# Patient Record
Sex: Male | Born: 1985 | Race: Black or African American | Hispanic: No | Marital: Single | Smoking: Former smoker
Health system: Southern US, Community
[De-identification: ages and names within clinical notes are randomized; demographics above are authoritative.]

## PROBLEM LIST (undated history)

## (undated) DIAGNOSIS — I10 Essential (primary) hypertension: Secondary | ICD-10-CM

## (undated) DIAGNOSIS — G4730 Sleep apnea, unspecified: Secondary | ICD-10-CM

## (undated) DIAGNOSIS — G4733 Obstructive sleep apnea (adult) (pediatric): Secondary | ICD-10-CM

## (undated) HISTORY — PX: FRACTURE SURGERY: SHX138

## (undated) HISTORY — DX: Sleep apnea, unspecified: G47.30

## (undated) HISTORY — PX: EYE SURGERY: SHX253

## (undated) MED ORDER — PANTOPRAZOLE 40 MG TAB, DELAYED RELEASE
40 mg | ORAL_TABLET | Freq: Every day | ORAL | Status: DC
Start: ? — End: 2013-09-10

## (undated) MED ORDER — ALBUTEROL SULFATE HFA 90 MCG/ACTUATION AEROSOL INHALER
90 mcg/actuation | Freq: Four times a day (QID) | RESPIRATORY_TRACT | Status: DC | PRN
Start: ? — End: 2014-05-02

## (undated) MED ORDER — HYDROCODONE-HOMATROPINE 5 MG-1.5 MG/5 ML (5 ML) ORAL SOLUTION
Freq: Four times a day (QID) | ORAL | Status: DC | PRN
Start: ? — End: 2014-05-02

## (undated) MED ORDER — AZITHROMYCIN 250 MG TAB
250 mg | PACK | ORAL | Status: DC
Start: ? — End: 2014-05-02

---
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
## 2011-07-26 MED ORDER — PROMETHAZINE 25 MG TAB
25 mg | ORAL_TABLET | Freq: Four times a day (QID) | ORAL | Status: DC | PRN
Start: 2011-07-26 — End: 2012-09-17

## 2011-07-26 MED ORDER — OXYCODONE-ACETAMINOPHEN 5 MG-325 MG TAB
5-325 mg | ORAL | Status: AC
Start: 2011-07-26 — End: 2011-07-26
  Administered 2011-07-26: 13:00:00 via ORAL

## 2011-07-26 MED ORDER — OXYCODONE-ACETAMINOPHEN 5 MG-325 MG TAB
5-325 mg | ORAL_TABLET | ORAL | Status: AC | PRN
Start: 2011-07-26 — End: 2011-08-02

## 2011-07-26 NOTE — ED Notes (Signed)
Splint applied by Veda Canning RN.

## 2011-07-26 NOTE — ED Provider Notes (Signed)
Patient is a 25 y.o. male presenting with ankle problem. The history is provided by the patient.   Ankle Injury   This is a new problem. The current episode started 1 to 2 hours ago. Pain location: twisted left ankle with pain to same. The pain is mild. Associated symptoms include limited range of motion. Pertinent negatives include no numbness. He has tried nothing for the symptoms. There has been a history of trauma.        No past medical history on file.     Past Surgical History   Procedure Date   ??? Hx tonsillectomy          No family history on file.     History     Social History   ??? Marital Status: N/A     Spouse Name: N/A     Number of Children: N/A   ??? Years of Education: N/A     Occupational History   ??? Not on file.     Social History Main Topics   ??? Smoking status: Current Everyday Smoker -- 0.5 packs/day   ??? Smokeless tobacco: Not on file   ??? Alcohol Use: Yes      1 x wk   ??? Drug Use: No   ??? Sexually Active:      Other Topics Concern   ??? Not on file     Social History Narrative   ??? No narrative on file                  ALLERGIES: Review of patient's allergies indicates no known allergies.      Review of Systems   Constitutional: Negative.    Musculoskeletal: Positive for joint swelling and arthralgias.   Skin: Negative.    Neurological: Negative.  Negative for numbness.   All other systems reviewed and are negative.        Filed Vitals:    07/26/11 0810   BP: 154/107   Pulse: 103   Temp: 98.2 ??F (36.8 ??C)   Resp: 18   Height: 5\' 8"  (1.727 m)   Weight: 129.275 kg (285 lb)            Physical Exam   Vitals reviewed.  Constitutional: He is oriented to person, place, and time. He appears well-developed and well-nourished. No distress.   Neck: Normal range of motion.   Musculoskeletal:        Left knee: Normal.         Left ankle: He exhibits decreased range of motion and swelling. He exhibits no ecchymosis, no deformity, no laceration and normal pulse. tenderness. Lateral malleolus tenderness found. No medial malleolus, no AITFL, no CF ligament, no posterior TFL, no head of 5th metatarsal and no proximal fibula tenderness found. Achilles tendon normal.   Neurological: He is alert and oriented to person, place, and time.   Skin: Skin is warm and dry.   Psychiatric: He has a normal mood and affect. His behavior is normal.        MDM     Differential Diagnosis; Clinical Impression; Plan:     Dr Benjamine Mola will see in office.  Amount and/or Complexity of Data Reviewed:   Clinical lab tests:  Reviewed      Splint, Short Leg  Date/Time: 07/26/2011 9:14 AM  Performed by: attendingPre-procedure re-eval: Immediately prior to the procedure, the patient was reevaluated and found suitable for the planned procedure and any planned medications.  Location details: left leg  Splint type: short leg  Approach: posterior  Supplies used: Ortho-Glass  Post-procedure: The splinted body part was neurovascularly unchanged following the procedure.  Patient tolerance: Patient tolerated the procedure well with no immediate complications.  My total time at bedside, performing this procedure was 1-15 minutes.

## 2011-07-26 NOTE — ED Notes (Signed)
Dr Benjamine Mola returned call to Dr Dareen Piano.

## 2011-07-26 NOTE — ED Notes (Signed)
Patient awaiting x-ray results.

## 2011-07-27 MED ORDER — CEFAZOLIN 2 G IN 100 ML 0.9% NS
2 gram/100 mL | Freq: Once | INTRAVENOUS | Status: AC
Start: 2011-07-27 — End: 2011-07-28
  Administered 2011-07-28: 18:00:00 via INTRAVENOUS

## 2011-07-27 NOTE — Other (Signed)
Left message for return call on only # listed

## 2011-07-27 NOTE — Other (Signed)
Patient's phone assessment complete.Pt verbalizes understanding of all pre-op instructions . Reinforced nothing to eat or drink after midnight on the day prior to surgery , this includes gum, mints, ice chips or water. Instructed that family must be present in building at all times.    Instructed Patient that they will be notified the day prior to surgery ( or the Friday before if surgery is on a Monday) of their arrival time by pre-op nurse with  Verbal understanding. Pt aware to bathe or shower with antibacterial soap on the night before  And am of surgery.      Pt instructed to obtain Hibiclens soap or surgical equivalent either here or at local pharmacy and wash surgical area in this for 20 seconds on dos. Instructed to wear freshly laundered clothes.    Patient safety information sheet given per registration with numbers for patient relations: [phone] and patient safety office:[phone]. Instructed pt that for any family member to obtain information or updates on pt's condition that they must have 4 digit code provided to them at registration. Pt verbalized understanding.     Instructed patient to continue  previous medications as prescribed prior to surgery and  to take percocet  On the Day of surgery with sip of water.  Do not hold any medications that you have not been instructed to do either at your pre-assessment or per your surgeon.    Continue all previous medications unless otherwise directed.      Instructed patient to stop the following medications prior to surgery: none    TYPE  1b   CASE   Labs per surgeon :unknown, called and left message for Dr Jaclynn Guarneri MA to fax orders on this pt  Labs per grid :none  EKG  :  none

## 2011-07-28 MED ORDER — LACTATED RINGERS IV
INTRAVENOUS | Status: DC
Start: 2011-07-28 — End: 2011-07-28

## 2011-07-28 MED ORDER — DIPHENHYDRAMINE HCL 50 MG/ML IJ SOLN
50 mg/mL | INTRAMUSCULAR | Status: DC | PRN
Start: 2011-07-28 — End: 2011-07-28

## 2011-07-28 MED ORDER — LIDOCAINE HCL 1 % (10 MG/ML) IJ SOLN
10 mg/mL (1 %) | INTRAMUSCULAR | Status: DC | PRN
Start: 2011-07-28 — End: 2011-07-28

## 2011-07-28 MED ORDER — FAMOTIDINE 20 MG TAB
20 mg | Freq: Once | ORAL | Status: AC
Start: 2011-07-28 — End: 2011-07-28
  Administered 2011-07-28: 17:00:00 via ORAL

## 2011-07-28 MED ORDER — PROMETHAZINE 25 MG/ML INJECTION
25 mg/mL | INTRAMUSCULAR | Status: DC | PRN
Start: 2011-07-28 — End: 2011-07-28

## 2011-07-28 MED ORDER — FENTANYL CITRATE (PF) 50 MCG/ML IJ SOLN
50 mcg/mL | Freq: Once | INTRAMUSCULAR | Status: AC
Start: 2011-07-28 — End: 2011-07-28
  Administered 2011-07-28: 17:00:00 via INTRAVENOUS

## 2011-07-28 MED ORDER — ONDANSETRON (PF) 4 MG/2 ML INJECTION
4 mg/2 mL | INTRAMUSCULAR | Status: DC | PRN
Start: 2011-07-28 — End: 2011-07-28

## 2011-07-28 MED ORDER — HYDROCODONE-ACETAMINOPHEN 5 MG-325 MG TAB
5-325 mg | ORAL | Status: DC | PRN
Start: 2011-07-28 — End: 2011-07-28

## 2011-07-28 MED ORDER — LACTATED RINGERS IV
INTRAVENOUS | Status: DC
Start: 2011-07-28 — End: 2011-07-28
  Administered 2011-07-28: 17:00:00 via INTRAVENOUS

## 2011-07-28 MED ORDER — MIDAZOLAM 1 MG/ML IJ SOLN
1 mg/mL | Freq: Once | INTRAMUSCULAR | Status: DC | PRN
Start: 2011-07-28 — End: 2011-07-28
  Administered 2011-07-28: 17:00:00 via INTRAVENOUS

## 2011-07-28 MED ORDER — DEXAMETHASONE SODIUM PHOSPHATE 4 MG/ML IJ SOLN
4 mg/mL | INTRAMUSCULAR | Status: DC | PRN
Start: 2011-07-28 — End: 2011-07-28

## 2011-07-28 MED ORDER — HYDROMORPHONE 2 MG/ML INJECTION SOLUTION
2 mg/mL | INTRAMUSCULAR | Status: DC | PRN
Start: 2011-07-28 — End: 2011-07-28

## 2011-07-28 NOTE — Op Note (Signed)
[facility]                            [address]                           [place], [place]. [address]                                [phone]                                OPERATIVE REPORT    NAME:  Eric Huynh, Eric Huynh,                         MR:  [id number]  Jr.  LOC:  SO                    SEXGillermina Hu:  [id number]  DOB:  14-Jan-1986            AGE:  25              PT:  S  ADMIT:  07/28/2011          DSCH:                 MSV:  SUR      PREPROCEDURE DIAGNOSIS: Left bimalleolar ankle fracture.    POSTPROCEDURE DIAGNOSIS: Left bimalleolar ankle fracture.    NAME OF PROCEDURE: Open reduction, internal fixation of left bimalleolar  ankle fracture.    SURGEON: Houston Siren, MD    ANESTHESIA: General anesthesia with LMA and popliteal block.    ESTIMATED BLOOD LOSS: Minimal.    TOURNIQUET TIME: 65 minutes at 300 mmHg.    ANTIBIOTIC PROPHYLAXIS: IV Ancef 2 grams given prior to procedure.    IMPLANTS USED: A Synthes titanium 1/3rd semitubular plate and 4 mm  cannulated screws.    INDICATIONS FOR PROCEDURE: The patient is a 25 year old African American  male with a left bimalleolar ankle fracture who presents for operative  fixation. Risks and benefits of procedure including, but not limited to  risk of anesthetic complications including myocardial infarction, stroke,  and death, as well as surgical complications including damage to nerves  and blood vessels, risk of infection, risk of incomplete pain relief,  risk of malunion, risk of nonunion, risk of posttraumatic arthritis, and  need for additional surgery have been discussed at length with the  patient. He understands the risks and wishes to proceed with surgery at  this time.    DETAILS OF PROCEDURE: The patient's correct operative site was marked  with indelible ink in the preoperative holding area. A block was placed  by the Department of Anesthesia. The patient was brought to the operating   table and placed supine. During a preop surgical timeout the left lower  extremity was identified as the correct surgical site and subsequently  prepped and draped in a standard sterile fashion using ChloraPrep  solution. A direct lateral approach to the distal fibula was performed  taking care to protect the superficial peroneal nerve branch. Fracture  site was identified, carefully curetted out and reduced in anatomic  alignment using reduction forceps. At  that point a 1/3rd semitubular  plate was then affixed using appropriate length screws and confirmed to  be adequately placed on image intensification. Medial approach to the  medial malleolus was then performed. The fracture site was then  identified and then reduced in anatomic alignment using a dental pick. At  that point 2 guidewires were placed for the cannulated screw system and  two 4 mm cannulated screws were advanced across the medial malleolus  fracture without difficulty. There was no evidence of syndesmotic injury  noted on intraoperative fluoroscopic testing. These wounds were irrigated  and closed using Monocryl and nylon sutures. A sterile dressing was  applied, followed by a well-padded posterior splint. Anesthesia was  discontinued. The patient was subsequently transferred back to recovery  room bed and taken to the recovery room in satisfactory condition. He  appeared to tolerate the procedure well. There were no apparent surgical  or anesthetic complications. All needle and sponge counts were correct.                [name], [name], MD     A                This is an unverified document unless signed by physician.    TID:  [id number]                                      DT:  07/28/2011  4:05 P  JOB:  [id number]        DOC#:  [id number]           DD:  07/28/2011    cc:   [name], [name], MD

## 2011-07-28 NOTE — Other (Signed)
Pt is here with report from OR. Pt has calm down since left OR. Pt is resting and pt has no co. Pt has good NV checks.

## 2011-07-28 NOTE — H&P (Addendum)
Visit Vitals   Item Reading   ??? BP 144/95   ??? Pulse 103   ??? Temp 98.1 ??F (36.7 ??C)   ??? Resp 20   ??? Ht 5\' 8"  (1.727 m)   ??? Wt 129.275 kg (285 lb)   ??? BMI 43.33 kg/m2   ??? SpO2 97%

## 2011-07-28 NOTE — Op Note (Signed)
[name]                            [address]                           [place], [place]. [address]                                [phone]                                OPERATIVE REPORT    NAME:  Eric Huynh, Eric Huynh,                         MR:  [id number]  Jr.  LOC:  SO                    SEXGillermina Hu:  [id number]  DOB:  1986/06/20            AGE:  25              PT:  S  ADMIT:  07/28/2011          DSCH:                 MSV:  SUR      PREPROCEDURE DIAGNOSIS: Left bimalleolar ankle fracture.    POSTPROCEDURE DIAGNOSIS: Left bimalleolar ankle fracture.    NAME OF PROCEDURE: Open reduction, internal fixation of left bimalleolar  ankle fracture.    SURGEON: Houston Siren, MD    ANESTHESIA: General anesthesia with LMA and popliteal block.    ESTIMATED BLOOD LOSS: Minimal.    TOURNIQUET TIME: 65 minutes at 300 mmHg.    ANTIBIOTIC PROPHYLAXIS: IV Ancef 2 grams given prior to procedure.    IMPLANTS USED: A Synthes titanium 1/3rd semitubular plate and 4 mm  cannulated screws.    INDICATIONS FOR PROCEDURE: The patient is a 25 year old African American  male with a left bimalleolar ankle fracture who presents for operative  fixation. Risks and benefits of procedure including, but not limited to  risk of anesthetic complications including myocardial infarction, stroke,  and death, as well as surgical complications including damage to nerves  and blood vessels, risk of infection, risk of incomplete pain relief,  risk of malunion, risk of nonunion, risk of posttraumatic arthritis, and  need for additional surgery have been discussed at length with the  patient. He understands the risks and wishes to proceed with surgery at  this time.    DETAILS OF PROCEDURE: The patient's correct operative site was marked  with indelible ink in the preoperative holding area. A block was placed  by the Department of Anesthesia. The patient was brought to the operating  table and placed  supine. During a preop surgical timeout the left lower  extremity was identified as the correct surgical site and subsequently  prepped and draped in a standard sterile fashion using ChloraPrep  solution. A direct lateral approach to the distal fibula was performed  taking care to protect the superficial peroneal nerve branch. Fracture  site was identified, carefully curetted out and reduced in anatomic  alignment using reduction forceps. At  that point a 1/3rd semitubular  plate was then affixed using appropriate length screws and confirmed to  be adequately placed on image intensification. Medial approach to the  medial malleolus was then performed. The fracture site was then  identified and then reduced in anatomic alignment using a dental pick. At  that point 2 guidewires were placed for the cannulated screw system and  two 4 mm cannulated screws were advanced across the medial malleolus  fracture without difficulty. There was no evidence of syndesmotic injury  noted on intraoperative fluoroscopic testing. These wounds were irrigated  and closed using Monocryl and nylon sutures. A sterile dressing was  applied, followed by a well-padded posterior splint. Anesthesia was  discontinued. The patient was subsequently transferred back to recovery  room bed and taken to the recovery room in satisfactory condition. He  appeared to tolerate the procedure well. There were no apparent surgical  or anesthetic complications. All needle and sponge counts were correct.                [name], [name], MD     A                This is an unverified document unless signed by physician.    TID:  [id number]                                      DT:  07/28/2011  4:05 P  JOB:  [id number]        DOC#:  [id number]           DD:  07/28/2011    cc:   [name], [name], MD

## 2012-04-23 NOTE — ED Provider Notes (Signed)
HPI Comments: Pt. Developed cough Friday and SOB yesterday.      Patient is a 26 y.o. male presenting with shortness of breath. The history is provided by the patient. No language interpreter was used.   Shortness of Breath  This is a new problem. The problem occurs continuously.The current episode started 2 days ago. The problem has not changed since onset.Associated symptoms include rhinorrhea and cough. Pertinent negatives include no fever, no headaches, no sore throat, no neck pain, no sputum production, no wheezing, no chest pain, no vomiting, no abdominal pain, no leg pain and no leg swelling. The problem's precipitants include smoke. He has tried nothing for the symptoms. Associated medical issues do not include COPD, chronic lung disease, CAD, heart failure or past MI.        Past Medical History   Diagnosis Date   ??? Unspecified sleep apnea      cpap   ??? Morbid obesity    ??? Ankle fracture, left    ??? Unspecified adverse effect of anesthesia      O2 sat drops   ??? Other ill-defined conditions      sleep apnea, non complaint with c-pap        Past Surgical History   Procedure Date   ??? Hx orthopaedic      ankle fx   ??? 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 Hx tonsillectomy    ??? Hx corneal transplant      right         History reviewed. No pertinent family history.     History     Social History   ??? Marital Status: SINGLE     Spouse Name: N/A     Number of Children: N/A   ??? Years of Education: N/A     Occupational History   ??? Not on file.     Social History Main Topics   ??? Smoking status: Current Everyday Smoker -- 0.5 packs/day for 5 years   ??? Smokeless tobacco: Never Used   ??? Alcohol Use: Yes      1 x wk   ??? Drug Use: No   ??? Sexually Active:      Other Topics Concern   ??? Not on file     Social History Narrative   ??? No narrative on file                  ALLERGIES: Review of patient's allergies indicates no known allergies.      Review of Systems   Constitutional: Negative for fever and chills.   HENT: Positive  for rhinorrhea. Negative for congestion, sore throat, sneezing, trouble swallowing, neck pain and neck stiffness.    Eyes: Negative for pain and redness.   Respiratory: Positive for cough and shortness of breath. Negative for sputum production, chest tightness and wheezing.    Cardiovascular: Negative for chest pain and leg swelling.   Gastrointestinal: Negative for nausea, vomiting and abdominal pain.   Genitourinary: Negative for dysuria and hematuria.   Musculoskeletal: Negative for back pain and gait problem.   Neurological: Negative for weakness, numbness and headaches.       Filed Vitals:    04/23/12 2124   BP: 154/89   Pulse: 103   Temp: 98.4 ??F (36.9 ??C)   Resp: 16   Height: 5\' 8"  (1.727 m)   Weight: 161.481 kg (356 lb)   SpO2: 95%  Physical Exam   Constitutional: He is oriented to person, place, and time. He appears well-developed and well-nourished.   HENT:   Head: Normocephalic and atraumatic.   Eyes: EOM are normal. Pupils are equal, round, and reactive to light.        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    Neck: Normal range of motion. Neck supple.   Cardiovascular: Normal rate and regular rhythm.    No murmur heard.  Pulmonary/Chest: Effort normal and breath sounds normal. No respiratory distress. He has no wheezes. He exhibits no tenderness.   Abdominal: Soft. Bowel sounds are normal. There is no tenderness.   Musculoskeletal: Normal range of motion. He exhibits no edema.   Lymphadenopathy:     He has no cervical adenopathy.   Neurological: He is alert and oriented to person, place, and time.   Skin: Skin is warm and dry.        MDM     Amount and/or Complexity of Data Reviewed:   Tests in the radiology section of CPT??:  Ordered and reviewed  Tests in the medicine section of the CPT??:  Ordered and reviewed  Risk of Significant Complications, Morbidity, and/or Mortality:   Presenting problems:  Low  Diagnostic procedures:  Low  Management options:  Low  Progress:   Patient  progress:  Stable      Procedures    XR CHEST PA LAT (Final result)   Result time:04/23/12 2327      Final result by Rad Results In Edi (04/23/12 23:27:33)      Impression:    IMPRESSION: Negative for acute change.            Narrative:    CHEST X-RAY, 2 views.    HISTORY: Cough.    TECHNIQUE: PA and lateral views.    COMPARISON: None    FINDINGS: The lungs are clear. The heart size is normal. The costophrenic  angles are sharp. The pulmonary vasculature is unremarkable. Included portion  of the upper abdomen is unremarkable.

## 2012-04-23 NOTE — ED Notes (Signed)
Coughing started on Friday and pt states when he's coughing he is having trouble taking a breath. Pt winded walking from lobby to triage room. Been using cough drops and cold pill (possible contact) and that's not helping. Faint wheezes noted when breath sounds auscultated from back.

## 2012-04-24 MED ORDER — BENZONATATE 100 MG CAP
100 mg | ORAL_CAPSULE | Freq: Three times a day (TID) | ORAL | Status: AC | PRN
Start: 2012-04-24 — End: 2012-05-01

## 2012-04-24 MED ORDER — AZITHROMYCIN 250 MG TAB
250 mg | PACK | ORAL | Status: DC
Start: 2012-04-24 — End: 2012-09-17

## 2012-04-24 MED ADMIN — albuterol-ipratropium (DUO-NEB) 2.5 MG-0.5 MG/3 ML: RESPIRATORY_TRACT | @ 04:00:00 | NDC 00487020101

## 2012-04-24 MED FILL — IPRATROPIUM-ALBUTEROL 2.5 MG-0.5 MG/3 ML NEB SOLUTION: 2.5 mg-0.5 mg/3 ml | RESPIRATORY_TRACT | Qty: 3

## 2012-04-24 NOTE — ED Notes (Signed)
I have reviewed discharge instructions with the patient. Prescriptions was given.   The patient verbalized understanding.  Discharged ambulatory  in no acute distress.

## 2012-09-17 LAB — CBC WITH AUTOMATED DIFF
ABS. BASOPHILS: 0 10*3/uL (ref 0.0–0.2)
ABS. EOSINOPHILS: 0.1 10*3/uL (ref 0.0–0.8)
ABS. IMM. GRANS.: 0 10*3/uL (ref 0.0–0.5)
ABS. LYMPHOCYTES: 1.8 10*3/uL (ref 0.5–4.6)
ABS. MONOCYTES: 0.5 10*3/uL (ref 0.1–1.3)
ABS. NEUTROPHILS: 7.6 10*3/uL (ref 1.7–8.2)
BASOPHILS: 0 % (ref 0.0–2.0)
EOSINOPHILS: 1 % (ref 0.5–7.8)
HCT: 42.6 % (ref 41.1–50.3)
HGB: 13.9 g/dL (ref 13.6–17.2)
IMMATURE GRANULOCYTES: 0.3 % (ref 0.0–5.0)
LYMPHOCYTES: 18 % (ref 13–44)
MCH: 30.2 PG (ref 26.1–32.9)
MCHC: 32.6 g/dL (ref 31.4–35.0)
MCV: 92.6 FL (ref 79.6–97.8)
MONOCYTES: 5 % (ref 4.0–12.0)
MPV: 10.8 FL (ref 10.8–14.1)
NEUTROPHILS: 76 % (ref 43–78)
PLATELET: 307 10*3/uL (ref 150–450)
RBC: 4.6 M/uL (ref 4.23–5.67)
RDW: 12.3 % (ref 11.9–14.6)
WBC: 10.1 10*3/uL (ref 4.3–11.1)

## 2012-09-17 LAB — METABOLIC PANEL, COMPREHENSIVE
A-G Ratio: 0.8 — ABNORMAL LOW (ref 1.2–3.5)
ALT (SGPT): 39 U/L (ref 12–65)
AST (SGOT): 26 U/L (ref 15–37)
Albumin: 3.5 g/dL (ref 3.5–5.0)
Alk. phosphatase: 54 U/L (ref 50–136)
Anion gap: 8 mmol/L (ref 7–16)
BUN: 16 MG/DL (ref 6–23)
Bilirubin, total: 0.5 MG/DL (ref 0.2–1.1)
CO2: 30 MMOL/L (ref 21–32)
Calcium: 9.4 MG/DL (ref 8.3–10.4)
Chloride: 101 MMOL/L (ref 98–107)
Creatinine: 1.2 MG/DL (ref 0.8–1.5)
GFR est AA: >60 mL/min/{1.73_m2} (ref >60)
GFR est non-AA: >60 mL/min/{1.73_m2} (ref >60)
Globulin: 4.2 g/dL — ABNORMAL HIGH (ref 2.3–3.5)
Glucose: 98 MG/DL (ref 65–100)
Potassium: 4.4 MMOL/L (ref 3.5–5.1)
Protein, total: 7.7 g/dL (ref 6.3–8.2)
Sodium: 139 MMOL/L (ref 136–145)

## 2012-09-17 LAB — LIPASE: Lipase: 87 U/L (ref 73–393)

## 2012-09-17 MED ADMIN — pantoprazole (PROTONIX) tablet 40 mg: ORAL | @ 07:00:00 | NDC 00008060704

## 2012-09-17 MED FILL — PROTONIX 40 MG TABLET,DELAYED RELEASE: 40 mg | ORAL | Qty: 1

## 2012-09-17 NOTE — ED Notes (Signed)
Pt c/o pain in his stomach that feel like hunger pains. Pt also c/o frequent urination, denies burning with urination or penile discharge.

## 2012-09-17 NOTE — ED Provider Notes (Addendum)
HPI Comments: 26 obese bm complains of epigastric pain and feeling like he is hungry even after he has eaten. No vomiting. The pain at times does radiate to the back. No vomiting.     Patient is a 26 y.o. male presenting with epigastric pain.   Epigastric Pain   Associated symptoms include dysuria. Pertinent negatives include no fever, no diarrhea, no nausea, no vomiting, no constipation, no hematuria, no chest pain and no back pain.        Past Medical History   Diagnosis Date   ??? Unspecified sleep apnea      cpap   ??? Morbid obesity    ??? Ankle fracture, left    ??? Unspecified adverse effect of anesthesia      O2 sat drops   ??? Other ill-defined conditions      sleep apnea, non complaint with c-pap        Past Surgical History   Procedure Laterality Date   ??? Hx orthopaedic       ankle fx   ??? 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 Hx tonsillectomy     ??? Hx corneal transplant       right         History reviewed. No pertinent family history.     History     Social History   ??? Marital Status: SINGLE     Spouse Name: N/A     Number of Children: N/A   ??? Years of Education: N/A     Occupational History   ??? Not on file.     Social History Main Topics   ??? Smoking status: Current Every Day Smoker -- 0.50 packs/day for 5 years   ??? Smokeless tobacco: Never Used   ??? Alcohol Use: Yes      Comment: 1 x wk   ??? Drug Use: No   ??? Sexually Active:      Other Topics Concern   ??? Not on file     Social History Narrative   ??? No narrative on file                  ALLERGIES: Review of patient's allergies indicates no known allergies.      Review of Systems   Constitutional: Negative for fever and fatigue.   HENT: Negative for neck pain and neck stiffness.    Respiratory: Negative for cough, choking and shortness of breath.    Cardiovascular: Negative for chest pain.   Gastrointestinal: Positive for abdominal pain. Negative for nausea, vomiting, diarrhea, constipation, blood in stool, abdominal distention, anal bleeding and rectal pain.    Genitourinary: Positive for dysuria. Negative for urgency, hematuria and flank pain.   Musculoskeletal: Negative for back pain.   Skin: Negative for rash.   All other systems reviewed and are negative.        Filed Vitals:    09/17/12 0026   BP: 159/91   Pulse: 112   Temp: 98.1 ??F (36.7 ??C)   Resp: 20   Height: 5\' 8"  (1.727 m)   Weight: 136.079 kg (300 lb)   SpO2: 96%            Physical Exam   Nursing note and vitals reviewed.  Constitutional: He is oriented to person, place, and time. He appears well-developed and well-nourished. No distress.   HENT:   Head: Normocephalic and atraumatic.   Right Ear: External ear normal.   Left  Ear: External ear normal.   Nose: Nose normal.   Mouth/Throat: Oropharynx is clear and moist. No oropharyngeal exudate.   Eyes: Conjunctivae and EOM are normal. Pupils are equal, round, and reactive to light.   Neck: Normal range of motion. Neck supple.   Cardiovascular: Normal rate, regular rhythm, normal heart sounds and intact distal pulses.    Pulmonary/Chest: Effort normal and breath sounds normal. No respiratory distress.   Abdominal: Soft. Bowel sounds are normal. He exhibits distension. He exhibits no mass. There is no tenderness. There is no guarding.   Minimal tenderness.    Musculoskeletal: Normal range of motion. He exhibits no tenderness.   Neurological: He is alert and oriented to person, place, and time.   Skin: Skin is warm. No rash noted.        MDM    Procedures

## 2012-09-17 NOTE — ED Notes (Signed)
I have reviewed discharge instructions and prescriptions with the patient.  The patient verbalized understanding. We also discussed the need to get another CPAP.

## 2012-09-17 NOTE — ED Notes (Signed)
Adjusted pillow behind patient to try and improve airway and respirations.

## 2013-09-10 MED ORDER — IPRATROPIUM-ALBUTEROL 2.5 MG-0.5 MG/3 ML NEB SOLUTION
2.5 mg-0.5 mg/3 ml | RESPIRATORY_TRACT | Status: AC
Start: 2013-09-10 — End: 2013-09-10
  Administered 2013-09-10: 08:00:00 via RESPIRATORY_TRACT

## 2013-09-10 NOTE — ED Notes (Signed)
Pt verbalized understanding of discharge teaching    Ambulatory from ED

## 2013-09-10 NOTE — ED Notes (Signed)
CXR neg  Feels some better after neb

## 2013-09-10 NOTE — ED Provider Notes (Signed)
HPI Comments: Some OSA in past. URI sx > 2 weeks. No dyspnea w/ cough--> yellow. No CP    Patient is a 27 y.o. male presenting with shortness of breath. The history is provided by the patient.   Shortness of Breath  This is a new problem. The current episode started more than 2 days ago. The problem has been gradually worsening. Associated symptoms include coryza, rhinorrhea, cough and sputum production. Pertinent negatives include no fever, no headaches, no sore throat, no ear pain, no neck pain, no hemoptysis, no wheezing, no orthopnea, no chest pain, no vomiting, no abdominal pain, no rash, no leg swelling and no claudication. Associated medical issues include chronic lung disease. Associated medical issues do not include asthma or COPD.        Past Medical History   Diagnosis Date   ??? Unspecified sleep apnea      cpap   ??? Morbid obesity    ??? Ankle fracture, left    ??? Unspecified adverse effect of anesthesia      O2 sat drops   ??? Other ill-defined conditions      sleep apnea, non complaint with c-pap        Past Surgical History   Procedure Laterality Date   ??? Hx orthopaedic       ankle fx   ??? 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 Hx tonsillectomy     ??? Hx corneal transplant       right         History reviewed. No pertinent family history.     History     Social History   ??? Marital Status: SINGLE     Spouse Name: N/A     Number of Children: N/A   ??? Years of Education: N/A     Occupational History   ??? Not on file.     Social History Main Topics   ??? Smoking status: Current Every Day Smoker -- 0.50 packs/day for 5 years   ??? Smokeless tobacco: Never Used   ??? Alcohol Use: Yes      Comment: 1 x wk   ??? Drug Use: No   ??? Sexually Active:      Other Topics Concern   ??? Not on file     Social History Narrative   ??? No narrative on file                  ALLERGIES: Review of patient's allergies indicates no known allergies.      Review of Systems   Constitutional: Negative for fever and chills.   HENT: Positive for  rhinorrhea. Negative for ear pain, sore throat and neck pain.    Respiratory: Positive for cough, sputum production and shortness of breath. Negative for hemoptysis and wheezing.    Cardiovascular: Negative for chest pain, palpitations, orthopnea, claudication and leg swelling.   Gastrointestinal: Negative for vomiting, abdominal pain and diarrhea.   Genitourinary: Negative for dysuria and flank pain.   Musculoskeletal: Negative for back pain.   Skin: Negative for color change and rash.   Neurological: Negative for syncope and headaches.       Filed Vitals:    09/10/13 0143 09/10/13 0210   BP: 164/109    Pulse: 114 112   Temp: 98.9 ??F (37.2 ??C)    Resp: 22    Height: 5\' 8"  (1.727 m)    Weight: 165.563 kg (365 lb)  SpO2: 93% 96%            Physical Exam   Nursing note and vitals reviewed.  Constitutional: He is oriented to person, place, and time. He appears well-developed and well-nourished. No distress.   HENT:   Head: Normocephalic and atraumatic.   Mouth/Throat: Oropharynx is clear and moist. No oropharyngeal exudate.   Eyes: EOM are normal. Pupils are equal, round, and reactive to light.   Neck: Normal range of motion. Neck supple.   Cardiovascular: Normal rate and regular rhythm.    No murmur heard.  Pulmonary/Chest: Breath sounds normal. No respiratory distress.   Neurological: He is alert and oriented to person, place, and time. Gait normal.   Nl speech   Skin: Skin is warm and dry.   Psychiatric: He has a normal mood and affect. His speech is normal.        MDM     Differential Diagnosis; Clinical Impression; Plan:     PNA  RAD  PNA    Amount and/or Complexity of Data Reviewed:   Tests in the radiology section of CPT??:  Reviewed   Independant visualization of image, tracing, or specimen:  Yes  Risk of Significant Complications, Morbidity, and/or Mortality:   Presenting problems:  Moderate  Diagnostic procedures:  Moderate  Management options:  Moderate  General Comments: Sat 93% but suspect from OSA long  term  Progress:   Patient progress:  Stable      Procedures

## 2013-09-10 NOTE — ED Notes (Signed)
Respiratory at bedside

## 2014-03-19 MED ORDER — HYDROCODONE-ACETAMINOPHEN 5 MG-325 MG TAB
5-325 mg | ORAL_TABLET | ORAL | Status: DC | PRN
Start: 2014-03-19 — End: 2014-05-02

## 2014-03-19 NOTE — ED Provider Notes (Signed)
HPI Comments: Patient is had right foot pain since Monday. He denies any trauma and denies any similar pain in the past.  Describes the pain is sharp and achy and worse with ambulation.  He states the pain has been getting progressively worse.he has had surgery on his left foot but never had any surgery on his right foot.  He denies any fever or chills.    Patient is a 28 y.o. male presenting with foot pain. The history is provided by the patient.   Foot Pain   This is a new problem. The problem occurs constantly. The problem has been gradually worsening. The pain is moderate. Associated symptoms include limited range of motion. Pertinent negatives include no numbness. The symptoms are aggravated by activity and standing. He has tried nothing for the symptoms. The treatment provided no relief. There has been no history of extremity trauma.        Past Medical History   Diagnosis Date   ??? Unspecified sleep apnea      cpap   ??? Morbid obesity (HCC)    ??? Ankle fracture, left    ??? Unspecified adverse effect of anesthesia      O2 sat drops   ??? Other ill-defined conditions(799.89)      sleep apnea, non complaint with c-pap        Past Surgical History   Procedure Laterality Date   ??? Hx orthopaedic       ankle fx   ??? 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 Hx tonsillectomy     ??? Hx corneal transplant       right         History reviewed. No pertinent family history.     History     Social History   ??? Marital Status: SINGLE     Spouse Name: N/A     Number of Children: N/A   ??? Years of Education: N/A     Occupational History   ??? Not on file.     Social History Main Topics   ??? Smoking status: Current Every Day Smoker -- 0.50 packs/day for 5 years   ??? Smokeless tobacco: Never Used   ??? Alcohol Use: Yes      Comment: 1 x wk   ??? Drug Use: No   ??? Sexual Activity: Not on file     Other Topics Concern   ??? Not on file     Social History Narrative                  ALLERGIES: Review of patient's allergies indicates no known allergies.       Review of Systems   Constitutional: Negative for fever and chills.   Gastrointestinal: Negative for nausea and vomiting.   Neurological: Negative for numbness.       Filed Vitals:    03/19/14 0541   BP: 159/84   Pulse: 107   Temp: 98.3 ??F (36.8 ??C)   Resp: 18   Height: 5\' 8"  (1.727 m)   Weight: 174.635 kg (385 lb)   SpO2: 99%            Physical Exam   Constitutional: He is oriented to person, place, and time. He appears well-developed and well-nourished.   HENT:   Head: Normocephalic and atraumatic.   Eyes: Conjunctivae are normal. Pupils are equal, round, and reactive to light.   Neck: Normal range of motion. Neck supple.  Musculoskeletal: He exhibits tenderness. He exhibits no edema.        Feet:    Tenderness to palpation at the indicated area.  There is no induration or redness noted and no swelling in comparison with his left foot   Neurological: He is alert and oriented to person, place, and time.   Skin: Skin is warm and dry.   Psychiatric: He has a normal mood and affect. His behavior is normal.   Nursing note and vitals reviewed.       MDM  Number of Diagnoses or Management Options  Foot sprain, right, initial encounter:   Diagnosis management comments: Differential diagnosis: Foot fracture, foot contusion, cellulitis  7:13 AM discussed results with patient and normal x-ray findings       Amount and/or Complexity of Data Reviewed  Tests in the radiology section of CPT??: ordered and reviewed  Independent visualization of images, tracings, or specimens: yes    Risk of Complications, Morbidity, and/or Mortality  Presenting problems: moderate  Diagnostic procedures: low  Management options: moderate    Patient Progress  Patient progress: stable      Procedures

## 2014-03-19 NOTE — ED Notes (Signed)
Per Dr. Domenic Politeircle work excuse given for 03/18/2014 and 03/19/2014.

## 2014-03-19 NOTE — ED Notes (Signed)
Pt c/o r foot pain, onset Monday and gradually got worse until tonight when he "has to crawl".

## 2014-03-19 NOTE — ED Notes (Signed)
Discharge instr., work excuse And RX x 1 Given and reviewed with pt, questions answered and pt verbalized understanding. Out via wheelchair with family assistance.

## 2014-05-02 DIAGNOSIS — Z9989 Dependence on other enabling machines and devices: Secondary | ICD-10-CM

## 2014-05-02 DIAGNOSIS — G4733 Obstructive sleep apnea (adult) (pediatric): Secondary | ICD-10-CM | POA: Insufficient documentation

## 2014-05-02 DIAGNOSIS — I10 Essential (primary) hypertension: Secondary | ICD-10-CM | POA: Insufficient documentation

## 2014-05-02 MED ORDER — LISINOPRIL-HYDROCHLOROTHIAZIDE 10 MG-12.5 MG TAB
ORAL_TABLET | Freq: Every day | ORAL | Status: DC
Start: 2014-05-02 — End: 2014-05-19

## 2014-05-02 NOTE — Patient Instructions (Signed)
DASH Diet: After Your Visit  Your Care Instructions  The DASH diet is an eating plan that can help lower your blood pressure. DASH stands for Dietary Approaches to Stop Hypertension. Hypertension is high blood pressure.  The DASH diet focuses on eating foods that are high in calcium, potassium, and magnesium. These nutrients can lower blood pressure. The foods that are highest in these nutrients are fruits, vegetables, low-fat dairy products, nuts, seeds, and legumes. But taking calcium, potassium, and magnesium supplements instead of eating foods that are high in those nutrients does not have the same effect. The DASH diet also includes whole grains, fish, and poultry.  The DASH diet is one of several lifestyle changes your doctor may recommend to lower your high blood pressure. Your doctor may also want you to decrease the amount of sodium in your diet. Lowering sodium while following the DASH diet can lower blood pressure even further than just the DASH diet alone.  Follow-up care is a key part of your treatment and safety. Be sure to make and go to all appointments, and call your doctor if you are having problems. It's also a good idea to know your test results and keep a list of the medicines you take.  How can you care for yourself at home?  Following the DASH diet  ?? Eat 4 to 5 servings of fruit each day. A serving is 1 medium-sized piece of fruit, ?? cup chopped or canned fruit, 1/4 cup dried fruit, or 4 ounces (?? cup) of fruit juice. Choose fruit more often than fruit juice.  ?? Eat 4 to 5 servings of vegetables each day. A serving is 1 cup of lettuce or raw leafy vegetables, ?? cup of chopped or cooked vegetables, or 4 ounces (?? cup) of vegetable juice. Choose vegetables more often than vegetable juice.  ?? Get 2 to 3 servings of low-fat and fat-free dairy each day. A serving is 8 ounces of milk, 1 cup of yogurt, or 1 ?? ounces of cheese.   ?? Eat 6 to 8 servings of grains each day. A serving is 1 slice of bread, 1 ounce of dry cereal, or ?? cup of cooked rice, pasta, or cooked cereal. Try to choose whole-grain products as much as possible.  ?? Limit lean meat, poultry, and fish to 2 servings each day. A serving is 3 ounces, about the size of a deck of cards.  ?? Eat 4 to 5 servings of nuts, seeds, and legumes (cooked dried beans, lentils, and split peas) each week. A serving is 1/3 cup of nuts, 2 tablespoons of seeds, or ?? cup of cooked beans or peas.  ?? Limit fats and oils to 2 to 3 servings each day. A serving is 1 teaspoon of vegetable oil or 2 tablespoons of salad dressing.  ?? Limit sweets and added sugars to 5 servings or less a week. A serving is 1 tablespoon jelly or jam, ?? cup sorbet, or 1 cup of lemonade.  ?? Eat less than 2,300 milligrams (mg) of sodium a day. If you have high blood pressure, diabetes, or chronic kidney disease, if you are African-American, or if you are older than age 50, try to limit the amount of sodium you eat to less than 1,500 mg a day.  Tips for success  ?? Start small. Do not try to make dramatic changes to your diet all at once. You might feel that you are missing out on your favorite foods and then be more   likely to not follow the plan. Make small changes, and stick with them. Once those changes become habit, add a few more changes.  ?? Try some of the following:  ?? Make it a goal to eat a fruit or vegetable at every meal and at snacks. This will make it easy to get the recommended amount of fruits and vegetables each day.  ?? Try yogurt topped with fruit and nuts for a snack or healthy dessert.  ?? Add lettuce, tomato, cucumber, and onion to sandwiches.  ?? Combine a ready-made pizza crust with low-fat mozzarella cheese and lots of vegetable toppings. Try using tomatoes, squash, spinach, broccoli, carrots, cauliflower, and onions.  ?? Have a variety of cut-up vegetables with a low-fat dip as an appetizer  instead of chips and dip.  ?? Sprinkle sunflower seeds or chopped almonds over salads. Or try adding chopped walnuts or almonds to cooked vegetables.  ?? Try some vegetarian meals using beans and peas. Add garbanzo or kidney beans to salads. Make burritos and tacos with mashed pinto beans or black beans.   Where can you learn more?   Go to MetropolitanBlog.huhttp://www.healthwise.net/[facility]  Enter H967 in the search box to learn more about "DASH Diet: After Your Visit."   ?? 2006-2015 Healthwise[employer] Inc[employer]. Care instructions adapted under license by [employer] (which disclaims liability or warranty for this information). This care instruction is for use with your licensed healthcare professional. If you have questions about a medical condition or this instruction, always ask your healthcare professional. Healthwise, Incorporated disclaims any warranty or liability for your use of this information.  Content Version: 10.5.422740; Current as of: November 15, 2013    Assessment:  1. Tobacco dependence: 05-02-14 education    2. Obesity    3. OSA on CPAP    4. HTN      Plan:   Modifyer-25 is indicated today for the tobacco cessation counseling as this was a separate issue needing addressed apart from their scheduled visit today.  [phone], face to face counseling as noted above and below regarding smoking cessation.    Strongly advised and encouraged to stop smoking.  Have gone over risk factors for various forms of cancer, pulmonary problems that can develop.  Quitting "cold [place]" has the best success rate.  I have addressed this individual's willingness to stop smoking.  I think the best quit plans involve the most proven resources who have the most proven resources who have the most tried and true methods. Please utilize the following: www.cancer.org, http://www.robinson.org/www.surgeongerneral.gov/tobacco/ and MechanicalArm.dkwww.smokefree.gov, www.familydoctor.org, MapleFlower.dkwww.lung.org/stop-smoking/, http://gates.com/www.cdc.gov/tobacco/quit_smoking/index.htm   I also have on staff a certified tobacco cessation counselor that I would be happy for you to meet with for sessions.  Please consider this and let me or staff know if you would like to take advantage of this service.  If you begin wellbutrin or chantix and have any depressive thoughts while taking these medicaitons, then please let us know and stop the medicines immediately.  Hypertension handout will be given and low salt diet is preferred. Eliminate smoking where appropriate.  Caution about the use of motrin, Advil, ibuprofen, Celebrex, aleve, naproxen etc which can raise blood pressure.  Tylenol (acetaminophen does not raise blood pressure). If you use one of the above, and it is necessary for the control of pain related to another disease or condition, then its benefits very likely outweigh the risks or problems associated with NOT using them. This is a decision you may have to make.  I's always helpful  to take those medications for two weeks as directed, then switch to tylenol for tow weeks etc.  Personal instruction is given.  For your information, consider the following: significant weight loss can result in a drop of 5-8710mm of blood pressure!  The "DASH" diet (see bookstore or go to MobTheme.nlwww.mayocliinc.com and print the DASH diet) will lower 8-7414mm of blood pressure.   Salt restriction will lower your pressure 2-858mm.  Lowering alcohol consumption to moderate use will lower your pressure 2-4 mm.  Continue efforts at weight loss or maintenance, watching sodium in your diet and trying to maintain an exercise regimen.  For those patients interested in an integrative option to help control their blood pressure, please try the following nonprescription natural remedies which you may find lower your blood pressure!  1. Calcium $ Magnesium has been shown to help reduce blood pressure. We stock "Osteoforce"  2. L-Arginine 750mg  1 a day or twice a day (available here)   3. Vitamin D3, 2,000IU daily with a regular meal is best absorbed  4. Co-Q10, 100mg  twice a day with a meal  5. Celery, a few sticks a day if you like celery.  Also, do not purchase non-organic celery, I recommend buy organic veggies if they are on the "dirty dozen."  Also, parsley and strawberries are natural diuretics.    Your goal for blood pressure target if you are older than 28 years old is equal to or lower than 149/89  Your goal for blood pressure target if you are younger than 28 years old is equal to or lower than 139/89    Recommend beginning a blood pressure med called lisin/hctz  Orders Placed This Encounter   ??? lisinopril-hydrochlorothiazide (PRINZIDE, ZESTORETIC) 10-12.5 mg per tablet     Followup:  Follow-up Disposition:  Return in about 3 weeks (around 05/23/2014) for recheck of bp and baseline hdl, htn-profile, fasting pre-d.

## 2014-05-02 NOTE — Progress Notes (Signed)
Subjective:  Eric Huynh is a 28 y.o. male presents today with medical problems and to obtain refills if necessary, and they are also meeting me as their new physician for the first time as their initial visit   today with possible new onset HTN. Further symptoms include the fact that (HPI) they have had dizzyness and elevated BPs at home or with employee nurse  There is a family history of hypertension YES.  Major risk factors for hypertension include tobacco use, obesity/elevated BMI, physical inactivity, dyslipidemia and diabetes?  Systems review of cardiovascular and pulmonary systems reveal no complaints or pertinent postivies.    How much are you exercising? not active  Do you smoke? YES  Are you taking your medicines as directed most every day? NO  Do you follow a low sodium DASH diet or similar high blood pressure diet? NO  Do you check your bp at home with an automated blood pressure device? NO  If you don't have a home bp machine, you should purchase one at home and begin to check your pressure at home on a regular basis.  Your blood pressure goal is listed under the "plan section" below    No Known Allergies  PMH, MEDS reviewed    Objective:  Blood pressure 148/107, pulse 101, height 5\' 8"  (1.727 m), weight 385 lb (174.635 kg).  General- pleasant, no distress  Psych- alert and oriented to person, place and time  Mood and affect are appropriate to the visit  rrr s mrg. bcta  Abdominal exam is without bruits, masses or renomegally  extremities are without edema, and dp, and pt pulses are intact  There are no carotid bruits  Fundoscopic exam is: benign without retinopathology   Less than 50% of today's visit is spent counseling with review of sx, disposition, options, and pt. Ed with regards to dangers of smoking, quitting, and staying off long term.   Approximately 8-10 minutes today are spent in face-to-face counseling as noted above and below regarding smoking cessation.    Assessment:   1. Tobacco dependence: 05-02-14 education    2. Obesity    3. OSA on CPAP    4. HTN      Plan:   Modifyer-25 is indicated today for the tobacco cessation counseling as this was a separate issue needing addressed apart from their scheduled visit today.  [id number], face to face counseling as noted above and below regarding smoking cessation.    Strongly advised and encouraged to stop smoking.  Have gone over risk factors for various forms of cancer, pulmonary problems that can develop.  Quitting "cold [place]" has the best success rate.  I have addressed this individual's willingness to stop smoking.  I think the best quit plans involve the most proven resources who have the most proven resources who have the most tried and true methods. Please utilize the following: www.cancer.org, http://www.robinson.org/ and MechanicalArm.dk, www.familydoctor.org, MapleFlower.dk, http://gates.com/  I also have on staff a certified tobacco cessation counselor that I would be happy for you to meet with for sessions.  Please consider this and let me or staff know if you would like to take advantage of this service.  If you begin wellbutrin or chantix and have any depressive thoughts while taking these medicaitons, then please let us know and stop the medicines immediately.  Hypertension handout will be given and low salt diet is preferred. Eliminate smoking where appropriate.  Caution about the use of motrin, Advil, ibuprofen, Celebrex, aleve, naproxen etc which  can raise blood pressure.  Tylenol (acetaminophen does not raise blood pressure). If you use one of the above, and it is necessary for the control of pain related to another disease or condition, then its benefits very likely outweigh the risks or problems associated with NOT using them. This is a decision you may have to make.  I's always helpful to take those medications for two weeks as directed, then switch  to tylenol for tow weeks etc.  Personal instruction is given.  For your information, consider the following: significant weight loss can result in a drop of 5-610mm of blood pressure!  The "DASH" diet (see bookstore or go to MobTheme.nlwww.mayocliinc.com and print the DASH diet) will lower 8-3614mm of blood pressure.   Salt restriction will lower your pressure 2-808mm.  Lowering alcohol consumption to moderate use will lower your pressure 2-4 mm.  Continue efforts at weight loss or maintenance, watching sodium in your diet and trying to maintain an exercise regimen.  For those patients interested in an integrative option to help control their blood pressure, please try the following nonprescription natural remedies which you may find lower your blood pressure!  1. Calcium $ Magnesium has been shown to help reduce blood pressure. We stock "Osteoforce"  2. L-Arginine 750mg  1 a day or twice a day (available here)  3. Vitamin D3, 2,000IU daily with a regular meal is best absorbed  4. Co-Q10, 100mg  twice a day with a meal  5. Celery, a few sticks a day if you like celery.  Also, do not purchase non-organic celery, I recommend buy organic veggies if they are on the "dirty dozen."  Also, parsley and strawberries are natural diuretics.    Your goal for blood pressure target if you are older than 28 years old is equal to or lower than 149/89  Your goal for blood pressure target if you are younger than 28 years old is equal to or lower than 139/89    Recommend beginning a blood pressure med called lisin/hctz  Orders Placed This Encounter   ??? lisinopril-hydrochlorothiazide (PRINZIDE, ZESTORETIC) 10-12.5 mg per tablet     Followup:  Follow-up Disposition:  Return in about 3 weeks (around 05/23/2014) for recheck of bp and baseline hdl, htn-profile, fasting pre-d.

## 2014-05-19 MED ORDER — LISINOPRIL-HYDROCHLOROTHIAZIDE 10 MG-12.5 MG TAB
ORAL_TABLET | Freq: Every day | ORAL | Status: AC
Start: 2014-05-19 — End: ?

## 2014-05-19 NOTE — Patient Instructions (Signed)
Learning About Bariatric Surgery  What is bariatric surgery?  Bariatric surgery is surgery to help you lose weight. This type of surgery is only used for people who are very overweight and have not been able to lose weight with diet and exercise.  This surgery makes the stomach smaller. Some types of surgery also change the connection between your stomach and intestines.  How is bariatric surgery done?  There are different types of bariatric surgery. Surgery may be either "open" or "laparoscopic." Open surgery is done through a large cut (incision) in the belly. Laparoscopic surgery is done through several small cuts. The doctor puts a lighted tube, or scope, and other surgical tools through small cuts in your belly. The doctor is able to see your organs with the scope.  Laparoscopic gastric banding  Laparoscopic gastric banding makes the stomach smaller. It does not change the connection between the stomach and the intestines.  The surgery is done through several small incisions in the belly. The doctor wraps a band around the upper part of the stomach. This creates a small pouch. The small size of the pouch means that you will get full after you eat just a small amount of food. The doctor can inflate or deflate the band to adjust the size. This lets the doctor adjust how quickly food passes from the new pouch into the stomach.  Roux-en-Y gastric bypass  Roux-en-Y (say "roo-en-why") gastric bypass makes the stomach smaller. It also changes the connection between the stomach and the intestines. It can be done either as open or laparoscopic surgery.  The doctor separates a section of your stomach from the rest of your stomach. This makes a small pouch. The new pouch will hold the food you eat. The doctor connects the stomach pouch to the middle part of the small intestine.  What can you expect after the surgery?  You may stay in the hospital for one or more days after the surgery. How  long you stay depends on the type of surgery you had.  Most people need 2 to 4 weeks before they are ready to get back to their usual routine.  For the first 2 to 6 weeks after surgery, you probably will need to follow a liquid or soft diet. Bit by bit, you will be able to eat more solid foods. Your doctor may advise you to work with a dietitian. This way you'll be sure to get enough protein, vitamins, and minerals while you are losing weight. Even with a healthy diet, you may need to take vitamin and mineral supplements.  After surgery, you will not be able to eat very much at one time. You will get full quickly. Try not to eat too much at one time or eat foods that are high in fat or sugar. If you do, you may vomit, get stomach pain, or have diarrhea.  You probably will lose weight very quickly in the first few months after surgery. As time goes on, your weight loss will slow down. You will have regular doctor visits to check how you are doing.  Think of bariatric surgery as a tool to help you lose weight. It isn't an instant fix. You will still need to eat a healthy diet and get regular exercise. This will help you reach your weight goal and avoid regaining the weight you lose.  Follow-up care is a key part of your treatment and safety. Be sure to make and go to all appointments, and call   your doctor if you are having problems. It's also a good idea to know your test results and keep a list of the medicines you take.   Where can you learn more?   Go to [email or web address]  Enter G469 in the search box to learn more about "Learning About Bariatric Surgery."   ?? 2006-2015 [employer]. Care instructions adapted under license by [employer] (which disclaims liability or warranty for this information). This care instruction is for use with your licensed healthcare professional. If you have questions about a medical condition  or this instruction, always ask your healthcare professional. Healthwise, Incorporated disclaims any warranty or liability for your use of this information.  Content Version: 10.5.422740; Current as of: November 15, 2013    Assessment:  1. Obesity    2. HTN       BP is well controlled  I want you to read about bariatric surgery.  Unless you succeed in loosing weight, you will need to consider this at some point in the future.    Plan:   Because current regimen for blood pressure is now working an tolerated, will continue medications as listed  Fasting baseline lipid hdl, sma7, and pre-d  Personal instruction is given.  For your information, consider the following: significant weight loss can result in a drop of 5-6710mm of blood pressure!  The "DASH" diet (see bookstore or go to [email or web address] and print the DASH diet) will lower 8-814mm of blood pressure.   Salt restriction will lower your pressure 2-208mm.  Lowering alcohol consumption to moderate use will lower your pressure 2-4 mm.    Continue efforts at weight loss or maintenance, watching sodium in your diet and trying to maintain an exercise regimen.    The goal for blood pressure target if you are older than 28 years old is equal to or lower than 149/89.  The goal for blood pressure target if you are younger than 28 years old OR ANY ADULT WITH  ckd or diabetes is equal to or lower than 139/89.    Orders Placed This Encounter   ??? LIPID PANEL (HDL ONLY)   ??? APOLIPOPROTEIN B (HDL ONLY)   ??? LDL-P HDL (HDL ONLY)   ??? CYSTATIN C (HDL ONLY)   ??? FACTOR V LEIDEN (HDL ONLY)   ??? PROTHROMBIN G20210A MUTATION   ??? LIPOPROTEIN ASSOC PHOSPHOLIPID (HDL ONLY)   ??? CRP, HIGH SENSITIVITY (HDL ONLY)   ??? FIBRINOGEN (HDL ONLY)   ??? HOMOCYSTEINE (HDL ONLY)   ??? FATTY ACIDS, FREE (HDL ONLY)   ??? INSULIN (HDL ONLY)   ??? NT-PROBNP (HDL ONLY)   ??? VITAMIN D, 25 HYDROXY (HDL ONLY)   ??? MYELOPEROXIDASE, AB (HDL ONLY)   ??? Pre-D   ??? METABOLIC PANEL, COMPREHENSIVE (HDL ONLY)    ??? CYSTATIN C (HDL ONLY)   ??? URIC ACID (HDL ONLY)   ??? lisinopril-hydrochlorothiazide (PRINZIDE, ZESTORETIC) 10-12.5 mg per tablet     Followup:  Follow-up Disposition:  Return for 81mo for bp chk.

## 2014-05-19 NOTE — Progress Notes (Signed)
Subjective:   Eric Huynh is a 28 y.o. male presents today for needing their blood pressure checked on their new regimen.  Patient denies side effects and is doing well.  Systems review of cardiovascular and pulmonary systems reveal no complaints or pertinent postivies.  Also here for fasting labs  How much are you exercising? Less than 3 days a week  Do you smoke? YES  Are you taking your medicines as directed most every day? YES  Do you follow a low sodium DASH diet or similar high blood pressure diet? NO  Do you check your bp at home with an automated blood pressure device? NO  If you don't have a home bp machine, you should purchase one at home and begin to check your pressure at home on a regular basis.  Your blood pressure goal is listed under the "plan section" below    No Known Allergies  PMH, MEDS reviewed    Objective:  Blood pressure 109/77, pulse 103, height 5\' 8"  (1.727 m), weight 378 lb (171.46 kg).  rrr s mrg. bcta  extremities are without edema.    Assessment:  1. Obesity    2. HTN       BP is well controlled    Plan:   Because current regimen for blood pressure is now working an tolerated, will continue medications as listed  Fasting baseline lipid hdl, sma7, and pre-d  Personal instruction is given.  For your information, consider the following: significant weight loss can result in a drop of 5-5510mm of blood pressure!  The "DASH" diet (see bookstore or go to [email or web address] and print the DASH diet) will lower 8-6614mm of blood pressure.   Salt restriction will lower your pressure 2-358mm.  Lowering alcohol consumption to moderate use will lower your pressure 2-4 mm.    Continue efforts at weight loss or maintenance, watching sodium in your diet and trying to maintain an exercise regimen.    The goal for blood pressure target if you are older than 28 years old is equal to or lower than 149/89.  The goal for blood pressure target if you are younger than 28 years old OR  ANY ADULT WITH  ckd or diabetes is equal to or lower than 139/89.    Orders Placed This Encounter   ??? LIPID PANEL (HDL ONLY)   ??? APOLIPOPROTEIN B (HDL ONLY)   ??? LDL-P HDL (HDL ONLY)   ??? CYSTATIN C (HDL ONLY)   ??? FACTOR V LEIDEN (HDL ONLY)   ??? PROTHROMBIN G20210A MUTATION   ??? LIPOPROTEIN ASSOC PHOSPHOLIPID (HDL ONLY)   ??? CRP, HIGH SENSITIVITY (HDL ONLY)   ??? FIBRINOGEN (HDL ONLY)   ??? HOMOCYSTEINE (HDL ONLY)   ??? FATTY ACIDS, FREE (HDL ONLY)   ??? INSULIN (HDL ONLY)   ??? NT-PROBNP (HDL ONLY)   ??? VITAMIN D, 25 HYDROXY (HDL ONLY)   ??? MYELOPEROXIDASE, AB (HDL ONLY)   ??? Pre-D   ??? METABOLIC PANEL, COMPREHENSIVE (HDL ONLY)   ??? CYSTATIN C (HDL ONLY)   ??? URIC ACID (HDL ONLY)   ??? lisinopril-hydrochlorothiazide (PRINZIDE, ZESTORETIC) 10-12.5 mg per tablet     Followup:  Follow-up Disposition:  Return for 64mo for bp chk.

## 2014-05-20 LAB — C-PEPTIDE (HDL ONLY)
C-Peptide: 4.3 ng/mL — ABNORMAL HIGH (ref 1.0–3.0)
C-Peptide: 4.3 ng/mL — ABNORMAL HIGH (ref 1.0–3.0)

## 2014-05-20 LAB — CRP, HIGH SENSITIVITY: CRP, High sensitivity: 18.6 mg/L — ABNORMAL HIGH (ref <1.0)

## 2014-05-20 LAB — FIBRINOGEN (HDL ONLY): Fibrinogen Antigen: 631 mg/dL — ABNORMAL HIGH (ref 126–437)

## 2014-05-20 LAB — FATTY ACIDS, FREE (HDL ONLY): FFA free fatty acids: 0.55 mmol/L (ref <0.60)

## 2014-05-20 LAB — HOMA-IR: HOMA-IR: 6.2 — ABNORMAL HIGH (ref <2.6)

## 2014-05-20 LAB — URIC ACID (HDL ONLY): Uric acid: 10.4 mg/dL — ABNORMAL HIGH (ref 2.0–6.9)

## 2014-05-20 LAB — MYELOPEROXIDASE, AB (HDL ONLY): MPOITA: 376 pmol/L — ABNORMAL HIGH (ref <321)

## 2014-05-20 LAB — LIPID PANEL (HDL ONLY)
Cholesterol, total: 143 mg/dL (ref <200)
HDL Cholesterol: 44 mg/dL (ref >39)
LDL, calculated: 92 mg/dL (ref <100)
Non-HDL Cholesterol: 99 mg/dL (ref <130)
Triglyceride: 89 mg/dL (ref <150)

## 2014-05-20 LAB — ADIPONECTIN (HDL ONLY): Adiponectin: 5 ug/mL — ABNORMAL LOW (ref >14)

## 2014-05-20 LAB — HEMOGLOBIN A1C (HDL ONLY)
Estimated Average Glucose: 102.5 mg/dL (ref <116.9)
HGBA1C-T: 5.2 % (ref <5.7)

## 2014-05-20 LAB — GLYCATION GAP (HDL ONLY): Glycation Gap: 0.38 (ref <0.45)

## 2014-05-20 LAB — NT-PROBNP (HDL ONLY): Pro BNP,NT: 97 pg/mL (ref <125)

## 2014-05-20 LAB — HOMOCYSTEINE (HDL ONLY): Homocysteine, plasma: 9 umol/L (ref <11)

## 2014-05-20 LAB — FERRITIN (HDL ONLY): Ferritin: 123 ng/mL (ref 30–400)

## 2014-05-20 LAB — CYSTATIN C (HDL ONLY)
Cystatin C: 1.03 mg/L — ABNORMAL HIGH (ref <0.96)
Estimated GFR: 80 mL/min/{1.73_m2} — ABNORMAL LOW (ref >89)

## 2014-05-20 LAB — INSULIN (HDL ONLY): Insulin: 30 uU/mL — ABNORMAL HIGH (ref 3–9)

## 2014-05-20 LAB — FRUCTOSAMINE (HDL ONLY): Fructosamine: 195 umol/L (ref <302.00)

## 2014-05-20 LAB — 1,5 ANHYDROGLUCITOL: 1,5-AG ASSAY: 27.5 ug/mL (ref >16.6)

## 2014-05-21 LAB — LDL-P HDL (HDL ONLY)
HDL HDL-P: 34.1 umol/L — ABNORMAL LOW (ref >38.0)
HDL LDL-P: 1390 nmol/L — ABNORMAL HIGH (ref <1020)
HDL SLDL-P: 871 nmol/L — ABNORMAL HIGH (ref <501)

## 2014-05-21 LAB — FACTOR V LEIDEN (HDL ONLY)

## 2014-05-21 LAB — APOLIPOPROTEIN B (HDL ONLY): Apolipoprotein B: 76 mg/dL — ABNORMAL HIGH (ref <60)

## 2014-05-21 LAB — METABOLIC PANEL, COMPREHENSIVE (HDL ONLY)
% ALBUMIN: 57 % (ref 54–71)
ALB/GLOBRATIO: 1.33 (ref 1.15–2.50)
ALT (SGPT): 18 U/L (ref <42)
AST (SGOT): 16 U/L (ref <41)
Albumin: 4 g/dL (ref 3.7–5.1)
Alk. phosphatase: 63 U/L (ref 35–117)
Anion gap: 8 (ref 6–18)
BUN: 15 mg/dL (ref 6–20)
Bilirubin, total: 0.7 mg/dL (ref <1.3)
CO2: 31 mmol/L (ref 19–31)
Calcium: 9.6 mg/dL (ref 8.8–10.5)
Chloride: 102 mmol/L (ref 98–110)
Creatinine: 1.1 mg/dL (ref 0.7–1.2)
GLOBCALC: 3 g/dL (ref 1.9–3.5)
Glucose: 84 mg/dL (ref 70–99)
Potassium: 4.5 mmol/L (ref 3.5–5.3)
Protein, total: 7 g/dL (ref 6.1–8.0)
Sodium: 142 mmol/L (ref 133–145)

## 2014-05-21 LAB — ANTI GAD ANTIBODIES (HDL ONLY): GAD-65 Ab: <5 IU/mL (ref <6)

## 2014-05-21 LAB — PROINSULIN (HDL ONLY)
Proinsulin: 39 pmol/L — ABNORMAL HIGH (ref <8)
Proinsulin: C-peptide ratio: 9.1 — ABNORMAL HIGH (ref <3.6)

## 2014-05-21 LAB — PROTHROMBIN G20210A MUTATION

## 2014-05-21 LAB — VITAMIN D, 25 HYDROXY (HDL ONLY): VITAMIN D, 25-HYDROXY: 11 ng/mL — ABNORMAL LOW (ref 30–100)

## 2014-05-21 LAB — LIPOPROTEIN ASSOC PHOSPHOLIPID (HDL ONLY): Lp-PLA2: 198 ng/mL (ref <200)

## 2014-05-21 LAB — IRI SCORE: HDL IRI: 6.1

## 2014-05-21 LAB — LEPTIN (HDL ONLY): Leptin: 109 ng/mL — ABNORMAL HIGH (ref <20)

## 2014-05-21 LAB — IRA ANALYTES
AHB: 3.7 ?g/mL (ref <4.5)
LGPC: 9.2 ?g/mL — ABNORMAL LOW (ref >13.0)
OA: 52 ?g/mL (ref <60)

## 2014-05-21 LAB — LEPTIN BMI RATIO: Leptin:BMI ratio: 1.9 — ABNORMAL HIGH (ref <0.65)

## 2014-07-25 ENCOUNTER — Emergency Department: Admit: 2014-07-25 | Payer: BLUE CROSS/BLUE SHIELD | Primary: Family Medicine

## 2014-07-25 ENCOUNTER — Ambulatory Visit
Admit: 2014-07-25 | Discharge: 2014-07-25 | Payer: BLUE CROSS/BLUE SHIELD | Attending: Family Medicine | Primary: Family Medicine

## 2014-07-25 ENCOUNTER — Inpatient Hospital Stay
Admit: 2014-07-25 | Discharge: 2014-07-25 | Disposition: A | Discharge status: Home / Self Care | Payer: BLUE CROSS/BLUE SHIELD | Attending: Emergency Medicine

## 2014-07-25 DIAGNOSIS — J9801 Acute bronchospasm: Secondary | ICD-10-CM

## 2014-07-25 DIAGNOSIS — J069 Acute upper respiratory infection, unspecified: Secondary | ICD-10-CM

## 2014-07-25 MED ORDER — FUROSEMIDE 40 MG TAB
40 mg | ORAL | Status: AC
Start: 2014-07-25 — End: 2014-07-25
  Administered 2014-07-25: 08:00:00 via ORAL

## 2014-07-25 MED ORDER — PREDNISONE 10 MG TABLETS IN A DOSE PACK
10 mg | ORAL_TABLET | ORAL | Status: DC
Start: 2014-07-25 — End: 2014-07-25

## 2014-07-25 MED ORDER — IPRATROPIUM-ALBUTEROL 2.5 MG-0.5 MG/3 ML NEB SOLUTION
2.5 mg-0.5 mg/3 ml | RESPIRATORY_TRACT | Status: AC
Start: 2014-07-25 — End: 2014-07-25
  Administered 2014-07-25: 08:00:00 via RESPIRATORY_TRACT

## 2014-07-25 MED ORDER — ALBUTEROL SULFATE HFA 90 MCG/ACTUATION AEROSOL INHALER
90 mcg/actuation | RESPIRATORY_TRACT | Status: AC | PRN
Start: 2014-07-25 — End: ?

## 2014-07-25 MED ORDER — METHYLPREDNISOLONE (PF) 125 MG/2 ML IJ SOLR
125 mg/2 mL | INTRAMUSCULAR | Status: AC
Start: 2014-07-25 — End: 2014-07-25
  Administered 2014-07-25: 07:00:00 via INTRAMUSCULAR

## 2014-07-25 MED ORDER — ALBUTEROL SULFATE HFA 90 MCG/ACTUATION AEROSOL INHALER
90 mcg/actuation | RESPIRATORY_TRACT | Status: DC | PRN
Start: 2014-07-25 — End: 2014-07-31

## 2014-07-25 MED ORDER — PREDNISONE 10 MG TAB
10 mg | ORAL_TABLET | ORAL | Status: DC
Start: 2014-07-25 — End: 2014-09-07

## 2014-07-25 MED ORDER — AZITHROMYCIN 250 MG TAB
250 mg | ORAL_TABLET | ORAL | Status: DC
Start: 2014-07-25 — End: 2014-07-25

## 2014-07-25 MED FILL — IPRATROPIUM-ALBUTEROL 2.5 MG-0.5 MG/3 ML NEB SOLUTION: 2.5 mg-0.5 mg/3 ml | RESPIRATORY_TRACT | Qty: 3

## 2014-07-25 MED FILL — FUROSEMIDE 40 MG TAB: 40 mg | ORAL | Qty: 1

## 2014-07-25 MED FILL — SOLU-MEDROL (PF) 125 MG/2 ML SOLUTION FOR INJECTION: 125 mg/2 mL | INTRAMUSCULAR | Qty: 2

## 2014-07-25 NOTE — Patient Instructions (Signed)
Assessment:  1. Bronchospasm    2. Morbid obesity (HCC)    3. OSA (obstructive sleep apnea)    4. Pleural effusion       Plan:     Orders Placed This Encounter   ??? REFERRAL TO PULMONARY DISEASE   ??? REFERRAL TO CARDIOLOGY   ??? albuterol (PROVENTIL HFA, VENTOLIN HFA, PROAIR HFA) 90 mcg/actuation inhaler   ??? predniSONE (DELTASONE) 10 mg tablet   strongly encouraged to stop smoking.  Strongly encouraged to attend the bariatric surgery free meeting at [facility] [place].  I have been very honest that if dramatic changes are not made in your lifestyle, medical condition treatment, then you would be lucky to see your 40th birthday  Pulmonary will further assess your lung condition but also your sleep apnea.  I will have cardiology look at your heart    Followup:  Follow-up Disposition: Not on File   Call if questions, no improvement or fever develops and if symptoms worsen.

## 2014-07-25 NOTE — ED Notes (Signed)
Pt reports he feels a little better but has trouble breathing when he lays down.

## 2014-07-25 NOTE — ED Provider Notes (Signed)
HPI Comments: 28 year old male complaining of shortness of breath.  Patient started to have shortness of breath approximately 4 hours ago.  He is recently had URI.  He is a smoker.    Patient is a 28 y.o. male presenting with shortness of breath. The history is provided by the patient.   Shortness of Breath  This is a new problem. The problem occurs continuously.The current episode started 3 to 5 hours ago. The problem has not changed since onset.Associated symptoms include rhinorrhea, sore throat, cough, sputum production and wheezing. Pertinent negatives include no fever, no orthopnea and no chest pain. He has tried nothing for the symptoms.                                    Past Medical History   Diagnosis Date   ??? Unspecified sleep apnea      cpap   ??? Morbid obesity (HCC)    ??? Ankle fracture, left    ??? Unspecified adverse effect of anesthesia      O2 sat drops   ??? Other ill-defined conditions(799.89)      sleep apnea, non complaint with c-pap   ??? Hypertension    ;     Past Surgical History   Procedure Laterality Date   ??? Hx orthopaedic       ankle fx   ??? 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 Hx tonsillectomy     ??? Hx corneal transplant       right         History reviewed. No pertinent family history.     History     Social History   ??? Marital Status: SINGLE     Spouse Name: N/A     Number of Children: N/A   ??? Years of Education: N/A     Occupational History   ??? Not on file.     Social History Main Topics   ??? Smoking status: Current Every Day Smoker -- 0.50 packs/day for 5 years   ??? Smokeless tobacco: Never Used   ??? Alcohol Use: Yes      Comment: 1 x wk   ??? Drug Use: No   ??? Sexual Activity: Not on file     Other Topics Concern   ??? Not on file     Social History Narrative                  ALLERGIES: Review of patient's allergies indicates no known allergies.      Review of Systems   Constitutional: Negative.  Negative for fever and activity change.   HENT: Positive for rhinorrhea and sore throat.     Eyes: Negative.    Respiratory: Positive for cough, sputum production, shortness of breath and wheezing.    Cardiovascular: Negative.  Negative for chest pain and orthopnea.   Gastrointestinal: Negative.    Genitourinary: Negative.    Musculoskeletal: Negative.    Skin: Negative.    Neurological: Negative.    Psychiatric/Behavioral: Negative.    All other systems reviewed and are negative.      Filed Vitals:    07/25/14 0248   BP: 176/101   Pulse: 105   Temp: 98 ??F (36.7 ??C)   Resp: 20   Height: 5\' 8"  (1.727 m)   Weight: 167.831 kg (370 lb)   SpO2: 96%  Physical Exam   Constitutional: He is oriented to person, place, and time. He appears well-developed and well-nourished. No distress.   HENT:   Head: Normocephalic and atraumatic.   Right Ear: External ear normal.   Left Ear: External ear normal.   Nose: Nose normal.   Mouth/Throat: Oropharynx is clear and moist. No oropharyngeal exudate.   Eyes: Conjunctivae and EOM are normal. Pupils are equal, round, and reactive to light. Right eye exhibits no discharge. Left eye exhibits no discharge. No scleral icterus.   Neck: Normal range of motion. Neck supple. No JVD present. No tracheal deviation present.   Cardiovascular: Normal rate, regular rhythm and intact distal pulses.    Pulmonary/Chest: Effort normal and breath sounds normal. No stridor. No respiratory distress. He has no wheezes. He exhibits no tenderness.   Abdominal: Soft. Bowel sounds are normal. He exhibits no distension and no mass. There is no tenderness.   Musculoskeletal: Normal range of motion. He exhibits no edema or tenderness.   Neurological: He is alert and oriented to person, place, and time. No cranial nerve deficit.   Skin: Skin is warm and dry. No rash noted. He is not diaphoretic. No erythema. No pallor.   Psychiatric: He has a normal mood and affect. His behavior is normal. Thought content normal.   Nursing note and vitals reviewed.       MDM   Number of Diagnoses or Management Options  Diagnosis management comments: No hypoxia or distress.       Amount and/or Complexity of Data Reviewed  Tests in the radiology section of CPT??: ordered and reviewed    Risk of Complications, Morbidity, and/or Mortality  Presenting problems: moderate  Diagnostic procedures: moderate  Management options: moderate                            Procedures

## 2014-07-25 NOTE — ED Notes (Signed)
Breathing tx in progress. Pt sat 96% when sitting upright.

## 2014-07-25 NOTE — ED Notes (Signed)
The patient was given their discharge instructions and  was given prescriptions.   The  patient verbalized understanding and had no additional questions. The patient was alert and was discharged via Ambulatory, without additional complaints at time of discharge.  No apparent distress noted

## 2014-07-25 NOTE — ED Notes (Signed)
States SOB starting Thursday , unable to go to sleep feels he is suffocating. nasal congestion.   Denies pain -

## 2014-07-25 NOTE — Progress Notes (Signed)
Subjective:  Eric Huynh is a 28 y.o. male presents today having been in ER this am with what sounds like acute bronchospasm.  Found to have pleural effusion.   His symptoms are clearly exacerbated by morbid obesity, tobacco dependence, OSA non compliant with CPAP, and now new onset suggestion of cardiac compromise.  He responded dramatically to lasix given in er.   His legs are already improved from edema he states and his breathing is better with the solumedrol and aerosol trx they gave him.  Cough np, no f,c,ns    No Known Allergies  PMH, MEDS reviewed    Objective:  Blood pressure 133/93, pulse 116, height 5\' 8"  (1.727 m), weight 370 lb (167.831 kg).   General- pleasant, no distress  Psych- alert and oriented to person, place and time  Mood and affect are appropriate to the visit  rrr s mrg. bcta interestingly.  Skin exam is wnl.    Assessment:  1. Bronchospasm    2. Morbid obesity (HCC)    3. OSA (obstructive sleep apnea)    4. Pleural effusion       Plan:     Orders Placed This Encounter   ??? REFERRAL TO PULMONARY DISEASE   ??? REFERRAL TO CARDIOLOGY   ??? albuterol (PROVENTIL HFA, VENTOLIN HFA, PROAIR HFA) 90 mcg/actuation inhaler   ??? predniSONE (DELTASONE) 10 mg tablet   strongly encouraged to stop smoking.  Strongly encouraged to attend the [facility].  I have been very honest that if dramatic changes are not made in your lifestyle, medical condition treatment, then you would be lucky to see your 40th birthday  Pulmonary will further assess your lung condition but also your sleep apnea.  I will have cardiology look at your heart    Followup:  Follow-up Disposition: Not on File   Call if questions, no improvement or fever develops and if symptoms worsen.

## 2014-07-31 ENCOUNTER — Ambulatory Visit
Admit: 2014-07-31 | Discharge: 2014-07-31 | Payer: BLUE CROSS/BLUE SHIELD | Attending: Critical Care Medicine | Primary: Family Medicine

## 2014-07-31 DIAGNOSIS — J984 Other disorders of lung: Secondary | ICD-10-CM | POA: Insufficient documentation

## 2014-07-31 DIAGNOSIS — G4719 Other hypersomnia: Secondary | ICD-10-CM | POA: Insufficient documentation

## 2014-07-31 DIAGNOSIS — Z9989 Dependence on other enabling machines and devices: Secondary | ICD-10-CM

## 2014-07-31 LAB — AMB POC SPIROMETRY

## 2014-07-31 NOTE — Progress Notes (Signed)
[address]  [place], [place] [id number]  [phone]    Patient Name:  Eric Huynh  Date of Birth:  1986/02/19      Office Visit 07/31/2014    CHIEF COMPLAINT:    Chief Complaint   Patient presents with   ??? New Patient     The patient is referred for shortness of breath.       HISTORY OF PRESENT ILLNESS:      We're requested to evaluate this patient for shortness of breath. He smokes and has quit a few days ago. He is morbidly obese. He has had sleep apnea since high school and did have a CPAP machine but lost it when he moved to [place]. [place]. He had some shortness of breath recently and some edema and went to the emergency room. He was given prednisone and an inhaler. He has no wheezing or sputum production.  He wakes up smothering at night. His girlfriend says he quits breathing, snores, thrashes about and he falls asleep easily during the day. He works the night shift. He apparently has are gone to the bariatric surgery meetings and realizes he has to do something dramatic about his weight. He has no chest pain or hemoptysis.        Past Medical History   Diagnosis Date   ??? Ankle fracture, left    ??? Unspecified adverse effect of anesthesia      O2 sat drops         Problem List  Date Reviewed: 07/31/2014        Codes Class Noted    Restrictive lung disease (Chronic) ICD-10-CM:  J98.4  ICD-9-CM:  518.89  07/31/2014        Obesity, morbid (HCC) (Chronic) ICD-10-CM:  E66.01  ICD-9-CM:  278.01  07/31/2014        Edema (Chronic) ICD-10-CM:  R60.9  ICD-9-CM:  782.3  07/31/2014        Daytime hypersomnolence (Chronic) ICD-10-CM:  G47.19  ICD-9-CM:  780.54  07/31/2014        Snoring (Chronic) ICD-10-CM:  R06.83  ICD-9-CM:  786.09  07/31/2014        Tobacco dependence: 05-02-14 education ICD-10-CM:  F17.200  ICD-9-CM:  305.1  05/02/2014        OSA on CPAP ICD-10-CM:  G47.33  ICD-9-CM:  327.23  05/02/2014        HTN ICD-10-CM:  I10  ICD-9-CM:  401.1  05/02/2014                 Past Surgical History   Procedure Laterality Date   ??? Hx orthopaedic       ankle fx   ??? 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 Hx tonsillectomy     ??? Hx corneal transplant       right       No flowsheet data found.      History     Social History   ??? Marital Status: SINGLE     Spouse Name: N/A     Number of Children: N/A   ??? Years of Education: N/A     Occupational History   ??? Not on file.     Social History Main Topics   ??? Smoking status: Current Every Day Smoker -- 0.50 packs/day for 5 years   ??? Smokeless tobacco: Never Used   ??? Alcohol Use: Yes  Comment: 1 x wk   ??? Drug Use: No   ??? Sexual Activity: Not on file     Other Topics Concern   ??? Not on file     Social History Narrative         Family History   Problem Relation Age of Onset   ??? Hypertension Mother    ??? Hypertension Maternal Grandmother    ??? Hypertension Maternal Grandfather    ??? Hypertension Paternal Grandmother    ??? Hypertension Paternal Grandfather          No Known Allergies      Current Outpatient Prescriptions   Medication Sig   ??? albuterol (PROVENTIL HFA, VENTOLIN HFA, PROAIR HFA) 90 mcg/actuation inhaler Take 2 Puffs by inhalation every four (4) hours as needed for Wheezing.   ??? predniSONE (DELTASONE) 10 mg tablet Take 4 for 2 days, then 3 for 2 days, then 2 for 2 days   ??? lisinopril-hydrochlorothiazide (PRINZIDE, ZESTORETIC) 10-12.5 mg per tablet Take 1 Tab by mouth daily. Indications: HYPERTENSION     No current facility-administered medications for this visit.           REVIEW OF SYSTEMS:   CONSTITUTIONAL:   There is no history of fever, chills, night sweats, weight loss.   EYES:   Denies problems with eye pain, erythema, blurred vision, or visual field loss.   ENTM:   Denies history of tinnitus, epistaxis, sore throat, hoarseness, or dysphonia.   LYMPH:   Denies swollen glands.   CARDIAC:   No chest pain, pressure, discomfort, palpitations, orthopnea, murmurs but does have edema               GI:    No dysphagia, heartburn reflux, nausea/vomiting, diarrhea, abdominal pain, or bleeding.   GU:   Denies history of dysuria, hematuria, polyuria, or decreased urine output.   MS:   No history of myalgias, arthralgias, bone pain, or muscle cramps.   SKIN:   No history of rashes, jaundice, cyanosis, nodules, or ulcers.   ENDO:   Negative for heat or cold intolerance.  No history of DM.   PSYCH:   Negative for anxiety, depression, insomnia, hallucinations.   NEURO:   There is no history of AMS, persistent headache, decreased level of consciousness, seizures, or motor or sensory deficits.      PHYSICAL EXAM:    Visit Vitals   Item Reading   ??? BP 164/94 mmHg   ??? Pulse 109   ??? Temp(Src) 98.5 ??F (36.9 ??C) (Oral)   ??? Resp 20   ??? Ht 5\' 9"  (1.753 m)   ??? Wt 396 lb 1.6 oz (179.67 kg)   ??? BMI 58.47 kg/m2   ??? SpO2 93%        GENERAL APPEARANCE:   The patient is morbidly obese and in no respiratory distress.   HEENT:   PERRL.  Conjunctivae unremarkable.   Nasal mucosa is without epistaxis, exudate, or polyps.  Gums and dentition are unremarkable.  There is no oropharyngeal narrowing.  TMs are clear.   NECK/LYMPHATIC:   Symmetrical with no elevation of jugular venous pulsation.  Trachea midline. No thyroid enlargement.  No cervical adenopathy.   LUNGS:   Normal respiratory effort with symmetrical lung expansion.   Breath sounds are clear.   HEART:   There is a regular rate and rhythm.  No murmur, rub, or gallop.  There is no edema in the lower extremities.   ABDOMEN:   Soft, huge and  non-tender.  No hepatosplenomegaly.  Bowel sounds are normal.     SKIN:   There are no rashes, cyanosis, jaundice, or ecchymosis present.   EXTREMITIES:   The extremities are unremarkable without clubbing, cyanosis, joint inflammation, degenerative, or ischemic change.   MUSCULOSKELETAL:   There is no abnormal tone, muscle atrophy, or abnormal movement present.   NEURO:   The patient is alert and oriented to person, place, and time.  Memory  appears intact and mood is normal.  No gross sensorimotor deficits are present. When I went into the room he is falling asleep.      DIAGNOSTIC TESTS:      CXR: Cardiomegaly    Spirometry:  Restrictive defect       ASSESSMENT:  (Medical Decision Making)         ICD-10-CM ICD-9-CM    1. OSA on CPAP???Previously. G47.33 327.23 REFERRAL TO SLEEP STUDIES   2. Pleural effusion???I really don't see any significant pleural effusion. J90 511.9 AMB POC SPIROMETRY   3. Restrictive lung disease???Secondary to obesity J98.4 518.89    4. Obesity, morbid (HCC) E66.01 278.01 REFERRAL TO SLEEP STUDIES   5. Edema???She probably has such profound hypoxemia that he is getting right-sided heart problems. R60.9 782.3    6. Daytime hypersomnolence G47.19 780.54    7. Snoring R06.83 786.09    8. Tobacco dependence: 05-02-14 education-t counseled F17.200 305.1    9. HTN I10 401.1           PLAN:    The patient was educated about the protean problems associated with obstructive sleep apnea and morbid obesity. He is going to have an urgent split sleep study with CPAP titration. He was referred to the surgical weight loss program. He will return in 4-6 months for me just to check on him. He is counseled regarding smoking cessation. He truly seems motivated. I do not believe he has any intrinsic lung disease.       Orders Placed This Encounter   ??? AMB POC SPIROMETRY   ??? REFERRAL TO SLEEP STUDIES       Over 50% of today's office visit was spent in face to face time reviewing test results/records, prognosis, importance of compliance, education about disease process, benefits of medications, instructions for management of acute flare-ups, and follow up plans.  Total time spent was 60 minutes.       Floyde ParkinsWAYNE M Zynia Wojtowicz, MD  Electronically signed    Dictated using voice recognition software.  Proof read but unrecognized errors may exist.

## 2014-08-12 ENCOUNTER — Inpatient Hospital Stay: Admit: 2014-08-18 | Payer: BLUE CROSS/BLUE SHIELD | Primary: Family Medicine

## 2014-08-12 DIAGNOSIS — G4733 Obstructive sleep apnea (adult) (pediatric): Secondary | ICD-10-CM

## 2014-08-15 DIAGNOSIS — G4733 Obstructive sleep apnea (adult) (pediatric): Secondary | ICD-10-CM

## 2014-08-16 ENCOUNTER — Inpatient Hospital Stay: Admit: 2014-08-19 | Payer: BLUE CROSS/BLUE SHIELD | Primary: Family Medicine

## 2014-08-19 ENCOUNTER — Encounter

## 2014-08-19 NOTE — Progress Notes (Signed)
I spoke with patient and he is aware that we are ordering BIPAP and oxygen for him to use at home.

## 2014-08-20 ENCOUNTER — Encounter

## 2014-09-07 ENCOUNTER — Inpatient Hospital Stay
Admit: 2014-09-07 | Discharge: 2014-09-07 | Disposition: A | Discharge status: Home / Self Care | Payer: BLUE CROSS/BLUE SHIELD | Attending: Emergency Medicine

## 2014-09-07 DIAGNOSIS — S39012A Strain of muscle, fascia and tendon of lower back, initial encounter: Secondary | ICD-10-CM

## 2014-09-07 MED ORDER — METHOCARBAMOL 750 MG TAB
750 mg | ORAL_TABLET | Freq: Three times a day (TID) | ORAL | Status: AC
Start: 2014-09-07 — End: ?

## 2014-09-07 MED ORDER — TRAMADOL 50 MG TAB
50 mg | ORAL_TABLET | Freq: Four times a day (QID) | ORAL | Status: AC | PRN
Start: 2014-09-07 — End: ?

## 2014-09-07 NOTE — ED Provider Notes (Signed)
HPI Comments: Patient states that he woke up this morning with right-sided back pain and spasm.  He did not injure it that he is aware of.  It was not hurting when he went to bed.  He is not having any trouble with urination, hematuria, dysuria, polyuria, trouble with bowel movements or hematochezia.  He has never had pain in his back before.  He is not having any chest pain or shortness breath.   He states when he told his mother's back was hurting she states "go to the ER." Patient is ambulatory to the room without difficulty.  He is well-hydrated.    Patient is a 28 y.o. male presenting with lower back pain. The history is provided by the patient.   LOW BACK PAIN   This is a new problem. Episode onset: "woke up this am with right sided low back pain and spasm"        Past Medical History:   Diagnosis Date   ??? Ankle fracture, left    ??? Unspecified adverse effect of anesthesia      O2 sat drops       Past Surgical History:   Procedure Laterality Date   ??? Hx orthopaedic       ankle fx   ??? 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 Hx tonsillectomy     ??? Hx corneal transplant       right         Family History:   Problem Relation Age of Onset   ??? Hypertension Mother    ??? Hypertension Maternal Grandmother    ??? Hypertension Maternal Grandfather    ??? Hypertension Paternal Grandmother    ??? Hypertension Paternal Grandfather        History     Social History   ??? Marital Status: SINGLE     Spouse Name: N/A     Number of Children: N/A   ??? Years of Education: N/A     Occupational History   ??? Not on file.     Social History Main Topics   ??? Smoking status: Former Smoker -- 0.50 packs/day for 5 years   ??? Smokeless tobacco: Never Used   ??? Alcohol Use: Yes      Comment: 1 x wk   ??? Drug Use: No   ??? Sexual Activity: Not on file     Other Topics Concern   ??? Not on file     Social History Narrative                ALLERGIES: Review of patient's allergies indicates not on file.      Review of Systems   Constitutional: Negative.     HENT: Negative.    Eyes: Negative.    Respiratory: Negative.    Cardiovascular: Negative.    Gastrointestinal: Negative.    Genitourinary: Negative.    Musculoskeletal: Positive for back pain.   Skin: Negative.    Neurological: Negative.    Psychiatric/Behavioral: Negative.    All other systems reviewed and are negative.      Filed Vitals:    09/07/14 0859   BP: 135/74   Pulse: 110   Temp: 98.2 ??F (36.8 ??C)   Resp: 22   Height: 5\' 8"  (1.727 m)   Weight: 183.707 kg (405 lb)   SpO2: 95%            Physical Exam   Constitutional: He is oriented to  person, place, and time. He appears well-developed and well-nourished.   HENT:   Head: Normocephalic and atraumatic.   Right Ear: External ear normal.   Left Ear: External ear normal.   Nose: Nose normal.   Mouth/Throat: Oropharynx is clear and moist.   Eyes: Conjunctivae and EOM are normal. Pupils are equal, round, and reactive to light.   Neck: Normal range of motion. Neck supple.   Cardiovascular: Normal rate, regular rhythm, normal heart sounds and intact distal pulses.    Pulmonary/Chest: Effort normal and breath sounds normal.   Abdominal: Soft. Bowel sounds are normal.   Musculoskeletal: Normal range of motion.        Lumbar back: He exhibits tenderness, pain and spasm. He exhibits no bony tenderness.        Back:    Neurological: He is alert and oriented to person, place, and time. He has normal reflexes.   Skin: Skin is warm and dry.   Psychiatric: He has a normal mood and affect. His behavior is normal. Judgment and thought content normal.   Nursing note and vitals reviewed.       MDM    Procedures    The patient was observed in the ED.  Patient was asked if he wanted me to do some x-rays for a urine test today and he declined. He states "I think I just slept on it wrong."  We will treat symptomatically and he will return if he is worsening. Warm, moist heat to area, massage, gentle range of motion and stretching to area.     I discussed the results of all labs, procedures, radiographs, and treatments with the patient and available family.  Treatment plan is agreed upon and the patient is ready for discharge.  All voiced understanding of the discharge plan and medication instructions or changes as appropriate.  Questions about treatment in the ED were answered.  All were encouraged to return should symptoms worsen or new problems develop.

## 2014-09-07 NOTE — ED Notes (Signed)
I have reviewed discharge instructions with the patient.  The patient verbalized understanding.

## 2014-10-02 ENCOUNTER — Encounter: Primary: Family Medicine

## 2014-11-10 ENCOUNTER — Encounter: Attending: Family Medicine | Primary: Family Medicine

## 2015-12-24 ENCOUNTER — Encounter (HOSPITAL_COMMUNITY): Payer: Self-pay

## 2015-12-24 ENCOUNTER — Emergency Department (HOSPITAL_COMMUNITY)
Admission: EM | Admit: 2015-12-24 | Discharge: 2015-12-24 | Disposition: A | Discharge status: Home / Self Care | Payer: Self-pay | Attending: Emergency Medicine | Admitting: Emergency Medicine

## 2015-12-24 DIAGNOSIS — L0291 Cutaneous abscess, unspecified: Secondary | ICD-10-CM

## 2015-12-24 DIAGNOSIS — I10 Essential (primary) hypertension: Secondary | ICD-10-CM | POA: Insufficient documentation

## 2015-12-24 DIAGNOSIS — L0211 Cutaneous abscess of neck: Secondary | ICD-10-CM | POA: Insufficient documentation

## 2015-12-24 HISTORY — DX: Essential (primary) hypertension: I10

## 2015-12-24 MED ORDER — LIDOCAINE-EPINEPHRINE (PF) 2 %-1:200000 IJ SOLN
20.0000 mL | Freq: Once | INTRAMUSCULAR | Status: AC
  Administered 2015-12-24: 20 mL
  Filled 2015-12-24: qty 20

## 2015-12-24 MED ORDER — HYDROCODONE-ACETAMINOPHEN 5-325 MG PO TABS: 1.0000 | ORAL_TABLET | Freq: Four times a day (QID) | ORAL | Status: DC | PRN

## 2015-12-24 MED ORDER — DOXYCYCLINE HYCLATE 100 MG PO CAPS: 100.0000 mg | ORAL_CAPSULE | Freq: Two times a day (BID) | ORAL | Status: DC

## 2015-12-24 NOTE — ED Notes (Signed)
Declined W/C at D/C and was escorted to lobby by RN. 

## 2015-12-24 NOTE — Discharge Instructions (Signed)
Abscess °An abscess is an infected area that contains a collection of pus and debris. It can occur in almost any part of the body. An abscess is also known as a furuncle or boil. °CAUSES  °An abscess occurs when tissue gets infected. This can occur from blockage of oil or sweat glands, infection of hair follicles, or a minor injury to the skin. As the body tries to fight the infection, pus collects in the area and creates pressure under the skin. This pressure causes pain. People with weakened immune systems have difficulty fighting infections and get certain abscesses more often.  °SYMPTOMS °Usually an abscess develops on the skin and becomes a painful mass that is red, warm, and tender. If the abscess forms under the skin, you may feel a moveable soft area under the skin. Some abscesses break open (rupture) on their own, but most will continue to get worse without care. The infection can spread deeper into the body and eventually into the bloodstream, causing you to feel ill.  °DIAGNOSIS  °Your caregiver will take your medical history and perform a physical exam. A sample of fluid may also be taken from the abscess to determine what is causing your infection. °TREATMENT  °Your caregiver may prescribe antibiotic medicines to fight the infection. However, taking antibiotics alone usually does not cure an abscess. Your caregiver may need to make a small cut (incision) in the abscess to drain the pus. In some cases, gauze is packed into the abscess to reduce pain and to continue draining the area. °HOME CARE INSTRUCTIONS  °· Only take over-the-counter or prescription medicines for pain, discomfort, or fever as directed by your caregiver. °· If you were prescribed antibiotics, take them as directed. Finish them even if you start to feel better. °· If gauze is used, follow your caregiver's directions for changing the gauze. °· To avoid spreading the infection: °· Keep your draining abscess covered with a  bandage. °· Wash your hands well. °· Do not share personal care items, towels, or whirlpools with others. °· Avoid skin contact with others. °· Keep your skin and clothes clean around the abscess. °· Keep all follow-up appointments as directed by your caregiver. °SEEK MEDICAL CARE IF:  °· You have increased pain, swelling, redness, fluid drainage, or bleeding. °· You have muscle aches, chills, or a general ill feeling. °· You have a fever. °MAKE SURE YOU:  °· Understand these instructions. °· Will watch your condition. °· Will get help right away if you are not doing well or get worse. °  °This information is not intended to replace advice given to you by your health care provider. Make sure you discuss any questions you have with your health care provider. °  °Document Released: 06/22/2005 Document Revised: 03/13/2012 Document Reviewed: 11/25/2011 °Elsevier Interactive Patient Education ©2016 [employer] ° °Incision and Drainage °Incision and drainage is a procedure in which a sac-like structure (cystic structure) is opened and drained. The area to be drained usually contains material such as pus, fluid, or blood.  °LET YOUR CAREGIVER KNOW ABOUT:  °· Allergies to medicine. °· Medicines taken, including vitamins, herbs, eyedrops, over-the-counter medicines, and creams. °· Use of steroids (by mouth or creams). °· Previous problems with anesthetics or numbing medicines. °· History of bleeding problems or blood clots. °· Previous surgery. °· Other health problems, including diabetes and kidney problems. °· Possibility of pregnancy, if this applies. °RISKS AND COMPLICATIONS °· Pain. °· Bleeding. °· Scarring. °· Infection. °BEFORE THE PROCEDURE  °  You may need to have an ultrasound or other imaging tests to see how large or deep your cystic structure is. Blood tests may also be used to determine if you have an infection or how severe the infection is. You may need to have a tetanus shot. °PROCEDURE  °The affected area  is cleaned with a cleaning fluid. The cyst area will then be numbed with a medicine (local anesthetic). A small incision will be made in the cystic structure. A syringe or catheter may be used to drain the contents of the cystic structure, or the contents may be squeezed out. The area will then be flushed with a cleansing solution. After cleansing the area, it is often gently packed with a gauze or another wound dressing. Once it is packed, it will be covered with gauze and tape or some other type of wound dressing.  °AFTER THE PROCEDURE  °· Often, you will be allowed to go home right after the procedure. °· You may be given antibiotic medicine to prevent or heal an infection. °· If the area was packed with gauze or some other wound dressing, you will likely need to come back in 1 to 2 days to get it removed. °· The area should heal in about 14 days. °  °This information is not intended to replace advice given to you by your health care provider. Make sure you discuss any questions you have with your health care provider. °  °Document Released: 03/08/2001 Document Revised: 03/13/2012 Document Reviewed: 11/07/2011 °Elsevier Interactive Patient Education ©2016 [employer] Inc[employer] ° °

## 2015-12-24 NOTE — ED Provider Notes (Signed)
History  By signing my name below, I, Karle PlumberJennifer Tensley, attest that this documentation has been prepared under the direction and in the presence of Rob [place], [place]. Electronically Signed: Karle PlumberJennifer Tensley, ED Scribe. 12/24/2015. 9:33 AM.  Chief Complaint  Patient presents with  . posterior neck abscess    The history is provided by the patient and medical records. No language interpreter was used.    HPI Comments:  Jacob Lloyd,Jacob Lloyd is a 30 y.o. obese male who presents to the Emergency Department complaining of an abscess to the posterior neck that appeared two days ago. Pt reports moderate soreness and some redness and drainage from the area. He has not done anything to treat the symptoms. Touching the area increases the pain. He denies alleviating factors. He denies fever, chills, nausea, vomiting.   Past Medical History  Diagnosis Date  . Hypertension    History reviewed. No pertinent past surgical history. No family history on file. Social History  Substance Use Topics  . Smoking status: Never Smoker   . Smokeless tobacco: None  . Alcohol Use: None    Review of Systems  Constitutional: Negative for fever and chills.  Gastrointestinal: Negative for nausea and vomiting.  Skin: Positive for color change.    Allergies  Review of patient's allergies indicates not on file.  Home Medications   Prior to Admission medications   Not on File   Triage Vitals: BP 153/88 mmHg  Pulse 78  Temp(Src) 98.2 F (36.8 C) (Oral)  Resp 18  SpO2 99% Physical Exam  Physical Exam  Constitutional: Pt is oriented to person, place, and time. Pt appears well-developed and well-nourished. No distress.  HENT: 3x3 cm abscess to posterior neck Head: Normocephalic and atraumatic.  Eyes: Conjunctivae are normal. No scleral icterus.  Neck: Normal range of motion.  Cardiovascular: Normal rate, regular rhythm and intact distal pulses.   Pulmonary/Chest: Effort normal and breath sounds  normal.  Abdominal: Soft. Pt exhibits no distension. There is no tenderness.  Lymphadenopathy:    Pt has no cervical adenopathy.  Neurological: Pt is alert and oriented to person, place, and time.  Skin: Skin is warm and dry. Pt is not diaphoretic. There is erythema.  Psychiatric: Pt has a normal mood and affect.  Nursing note and vitals reviewed.  ED Course  Procedures (including critical care time) DIAGNOSTIC STUDIES: Oxygen Saturation is 99% on RA, normal by my interpretation.   COORDINATION OF CARE: 9:11 AM- Will I & D abscess and prescribe antibiotics. Pt verbalizes understanding and agrees to plan.  Medications  lidocaine-EPINEPHrine (XYLOCAINE W/EPI) 2 %-1:200000 (PF) injection 20 mL (20 mLs Infiltration Given 12/24/15 0919)    INCISION AND DRAINAGE Performed by: Roxy HorsemanBROWNING, Shayana Hornstein Consent: Verbal consent obtained. Risks and benefits: risks, benefits and alternatives were discussed Type: abscess  Body area: posterior neck/scalp  Anesthesia: local infiltration  Incision was made with a scalpel.  Local anesthetic: lidocaine 2% with epinephrine  Anesthetic total: 4 ml  Complexity: complex Blunt dissection to break up loculations  Drainage: purulent  Drainage amount: moderate  Packing material: note packed  Patient tolerance: Patient tolerated the procedure well with no immediate complications.     MDM   Final diagnoses:  Abscess    Patient with skin abscess. Incision and drainage performed in the ED today.  Abscess was not large enough to warrant packing or drain placement. Wound recheck in 2 days as needed. Supportive care and return precautions discussed.  Pt sent home with doxycycline. The patient appears reasonably  screened and/or stabilized for discharge and I doubt any other emergent medical condition requiring further screening, evaluation, or treatment in the ED prior to discharge.   I personally performed the services described in this  documentation, which was scribed in my presence. The recorded information has been reviewed and is accurate.       Roxy Horseman, PA-C 12/24/15 0942  Roxy Horseman, PA-C 12/24/15 1054  Mancel Bale, MD 12/24/15 1539

## 2015-12-24 NOTE — ED Notes (Signed)
Patient here with redness, soreness and drainage from ingrown hair x 2 days. Located at back of neck

## 2016-01-09 ENCOUNTER — Encounter (HOSPITAL_COMMUNITY): Payer: Self-pay | Admitting: Emergency Medicine

## 2016-01-09 ENCOUNTER — Emergency Department (HOSPITAL_COMMUNITY)
Admission: EM | Admit: 2016-01-09 | Discharge: 2016-01-09 | Disposition: A | Discharge status: Home / Self Care | Payer: Self-pay | Attending: Emergency Medicine | Admitting: Emergency Medicine

## 2016-01-09 ENCOUNTER — Emergency Department (HOSPITAL_COMMUNITY): Payer: Self-pay

## 2016-01-09 DIAGNOSIS — Z792 Long term (current) use of antibiotics: Secondary | ICD-10-CM | POA: Insufficient documentation

## 2016-01-09 DIAGNOSIS — M25571 Pain in right ankle and joints of right foot: Secondary | ICD-10-CM | POA: Insufficient documentation

## 2016-01-09 DIAGNOSIS — I10 Essential (primary) hypertension: Secondary | ICD-10-CM | POA: Insufficient documentation

## 2016-01-09 MED ORDER — DIAZEPAM 5 MG PO TABS: 5.0000 mg | ORAL_TABLET | Freq: Two times a day (BID) | ORAL | Status: DC

## 2016-01-09 MED ORDER — DIAZEPAM 5 MG PO TABS
5.0000 mg | ORAL_TABLET | Freq: Once | ORAL | Status: AC
  Administered 2016-01-09: 5 mg via ORAL
  Filled 2016-01-09: qty 1

## 2016-01-09 MED ORDER — TRAMADOL HCL 50 MG PO TABS: 50.0000 mg | ORAL_TABLET | Freq: Four times a day (QID) | ORAL | Status: DC | PRN

## 2016-01-09 MED ORDER — KETOROLAC TROMETHAMINE 60 MG/2ML IM SOLN
60.0000 mg | Freq: Once | INTRAMUSCULAR | Status: AC
  Administered 2016-01-09: 60 mg via INTRAMUSCULAR
  Filled 2016-01-09: qty 2

## 2016-01-09 NOTE — ED Provider Notes (Signed)
CSN: [id number]     Arrival date & time 01/09/16  1325 History  By signing my name below, I, [name], attest that this documentation has been prepared under the direction and in the presence of S. Lane HackerNicole Michaelene Dutan, PA-C Electronically Signed: [name], ED Scribe. 01/09/2016. 3:14 PM.    Chief Complaint  Patient presents with  . Ankle Pain      The history is provided by the patient. No language interpreter was used.    Jacob Lloyd is a 30 y.o. male with a medical hx of HTN who presents to the Emergency Department complaining of right ankle pain onset 4 days. Pt reports that he woke up this morning and had right ankle pain with associated swelling. Pt states that his right ankle pain is worsened with ambulation and weight bearing. Pt denies any injury or trauma.  Pt is having associated symptoms of right ankle swelling and gait problem due to pain. He notes that he has not tried any medications for the relief of his symptoms. He denies color change, wound, rash, and any other symptoms.    Past Medical History  Diagnosis Date  . Hypertension    History reviewed. No pertinent past surgical history. No family history on file. Social History  Substance Use Topics  . Smoking status: Never Smoker   . Smokeless tobacco: None  . Alcohol Use: None    Review of Systems  A complete 10 system review of systems was obtained and all systems are negative except as noted in the HPI and PMH.   Allergies  Review of patient's allergies indicates not on file.  Home Medications   Prior to Admission medications   Medication Sig Start Date End Date Taking? Authorizing Provider  doxycycline (VIBRAMYCIN) 100 MG capsule Take 1 capsule (100 mg total) by mouth 2 (two) times daily. 12/24/15   Roxy Horsemanobert Browning, PA-C  HYDROcodone-acetaminophen (NORCO/VICODIN) 5-325 MG tablet Take 1-2 tablets by mouth every 6 (six) hours as needed. 12/24/15   Roxy Horsemanobert Browning, PA-C   BP 173/118 mmHg  Pulse 76   Temp(Src) 98.2 F (36.8 C) (Oral)  Resp 16  SpO2 100% Physical Exam  Constitutional: He is oriented to person, place, and time. He appears well-developed and well-nourished. No distress.  HENT:  Head: Normocephalic and atraumatic.  Eyes: EOM are normal.  Neck: Neck supple.  Cardiovascular: Normal rate.   Pulmonary/Chest: Effort normal. No respiratory distress.  Abdominal: He exhibits no distension.  Musculoskeletal: Normal range of motion.       Right ankle: He exhibits normal range of motion and no swelling. No tenderness.  Right ankle with no erythema or swelling. No TTP. Full ROM. NVI  Neurological: He is alert and oriented to person, place, and time.  Skin: Skin is warm and dry.  Psychiatric: He has a normal mood and affect. His behavior is normal.  Nursing note and vitals reviewed.   ED Course  Procedures DIAGNOSTIC STUDIES: Oxygen Saturation is 100% on RA, nl by my interpretation.    COORDINATION OF CARE: 3:13 PM Discussed treatment plan with pt at bedside which includes right ankle xray and pt agreed to plan.  Imaging Review Dg Ankle Complete Right  01/09/2016  CLINICAL DATA:  Right ankle pain since this morning after getting out of bed. No specific injury. EXAM: RIGHT ANKLE - COMPLETE 3+ VIEW COMPARISON:  None. FINDINGS: There is no evidence of fracture, dislocation, or joint effusion. There is no evidence of arthropathy or other focal bone abnormality. Soft  tissues are unremarkable. IMPRESSION: Normal examination. Electronically Signed   By: Beckie Salts M.D.   On: 01/09/2016 14:22   I have personally reviewed and evaluated these images as part of my medical decision-making.   MDM   Final diagnoses:  Ankle pain, right   Based on patient history and physical exam, most likely etiologies include MSK. Less likely etiologies include trauma (fracture, dislocation, hemarthrosis, osteonecrosis, tenosynovitis), infectious (non-GC vs GC septic arthritis, reactive arthritis,  tenosynovitis, lyme disease), rheumatologic (gout, pseudogout, rheumatoid arthritis, osteoarthritis). X-ray negative. Patient may be safely discharged home with ace wrap, valium, tramadol. Discussed reasons for return. Patient to follow-up with primary care provider within one week. Patient in understanding and agreement with the plan.   I personally performed the services described in this documentation, which was scribed in my presence. The recorded information has been reviewed and is accurate.   Melton Krebs, PA-C 01/14/16 1725  Jerelyn Scott, MD 01/14/16 [phone]

## 2016-01-09 NOTE — Discharge Instructions (Signed)
Mr. Jacob Lloyd,  Nice meeting you! Please follow-up with your primary care provider. Return to the emergency department if you develop discolorations, increasing pain, inability to ambulate. Feel better soon!  S. Lane HackerNicole Tanice Petre, PA-C Ankle Pain Ankle pain is a common symptom. The bones, cartilage, tendons, and muscles of the ankle joint perform a lot of work each day. The ankle joint holds your body weight and allows you to move around. Ankle pain can occur on either side or back of 1 or both ankles. Ankle pain may be sharp and burning or dull and aching. There may be tenderness, stiffness, redness, or warmth around the ankle. The pain occurs more often when a person walks or puts pressure on the ankle. CAUSES  There are many reasons ankle pain can develop. It is important to work with your caregiver to identify the cause since many conditions can impact the bones, cartilage, muscles, and tendons. Causes for ankle pain include:  Injury, including a break (fracture), sprain, or strain often due to a fall, sports, or a high-impact activity.  Swelling (inflammation) of a tendon (tendonitis).  Achilles tendon rupture.  Ankle instability after repeated sprains and strains.  Poor foot alignment.  Pressure on a nerve (tarsal tunnel syndrome).  Arthritis in the ankle or the lining of the ankle.  Crystal formation in the ankle (gout or pseudogout). DIAGNOSIS  A diagnosis is based on your medical history, your symptoms, results of your physical exam, and results of diagnostic tests. Diagnostic tests may include X-ray exams or a computerized magnetic scan (magnetic resonance imaging, MRI). TREATMENT  Treatment will depend on the cause of your ankle pain and may include:  Keeping pressure off the ankle and limiting activities.  Using crutches or other walking support (a cane or brace).  Using rest, ice, compression, and elevation.  Participating in physical therapy or home  exercises.  Wearing shoe inserts or special shoes.  Losing weight.  Taking medications to reduce pain or swelling or receiving an injection.  Undergoing surgery. HOME CARE INSTRUCTIONS   Only take over-the-counter or prescription medicines for pain, discomfort, or fever as directed by your caregiver.  Put ice on the injured area.  Put ice in a plastic bag.  Place a towel between your skin and the bag.  Leave the ice on for 15-20 minutes at a time, 03-04 times a day.  Keep your leg raised (elevated) when possible to lessen swelling.  Avoid activities that cause ankle pain.  Follow specific exercises as directed by your caregiver.  Record how often you have ankle pain, the location of the pain, and what it feels like. This information may be helpful to you and your caregiver.  Ask your caregiver about returning to work or sports and whether you should drive.  Follow up with your caregiver for further examination, therapy, or testing as directed. SEEK MEDICAL CARE IF:   Pain or swelling continues or worsens beyond 1 week.  You have an oral temperature above 102 F (38.9 C).  You are feeling unwell or have chills.  You are having an increasingly difficult time with walking.  You have loss of sensation or other new symptoms.  You have questions or concerns. MAKE SURE YOU:   Understand these instructions.  Will watch your condition.  Will get help right away if you are not doing well or get worse.   This information is not intended to replace advice given to you by your health care provider. Make sure you discuss  any questions you have with your health care provider.   Document Released: 03/02/2010 Document Revised: 12/05/2011 Document Reviewed: 04/14/2015 Elsevier Interactive Patient Education [employer]

## 2016-01-09 NOTE — ED Notes (Signed)
Per pt, states right ankle pain when he woke up on Wednesday-no injury/trauma

## 2016-02-29 ENCOUNTER — Emergency Department (HOSPITAL_COMMUNITY)
Admission: EM | Admit: 2016-02-29 | Discharge: 2016-02-29 | Disposition: A | Discharge status: Home / Self Care | Payer: No Typology Code available for payment source | Attending: Emergency Medicine | Admitting: Emergency Medicine

## 2016-02-29 ENCOUNTER — Encounter (HOSPITAL_COMMUNITY): Payer: Self-pay | Admitting: Emergency Medicine

## 2016-02-29 DIAGNOSIS — I10 Essential (primary) hypertension: Secondary | ICD-10-CM | POA: Insufficient documentation

## 2016-02-29 DIAGNOSIS — M25571 Pain in right ankle and joints of right foot: Secondary | ICD-10-CM

## 2016-02-29 MED ORDER — KETOROLAC TROMETHAMINE 15 MG/ML IJ SOLN
15.0000 mg | Freq: Once | INTRAMUSCULAR | Status: AC
Start: 2016-02-29 — End: 2016-02-29
  Administered 2016-02-29: 15 mg via INTRAMUSCULAR
  Filled 2016-02-29: qty 1

## 2016-02-29 NOTE — ED Provider Notes (Signed)
CSN: [id number]     Arrival date & time 02/29/16  1657 History  By signing my name below, I, Jacob Lloyd, attest that this documentation has been prepared under the direction and in the presence of Newell RubbermaidJeffrey Christana Angelica, PA-C. Electronically Signed: Phillis HaggisGabriella Lloyd, ED Scribe. 02/29/2016. 8:36 PM.   Chief Complaint  Patient presents with  . Ankle Pain   The history is provided by the patient. No language interpreter was used.  HPI Comments: Jacob Lloyd is a 30 y.o. Male with a hx of HTN who presents to the Emergency Department complaining of gradually worsening, aching right ankle pain onset one week ago. Pt reports that his ankle feels stiff. He reports that he woke up 4 days ago and the pain had worsened to the point where he could not walk on it. He was evaluated for similar symptoms 3 months ago. He states that x-rays were performed with negative results. Pt has been taking Tramadol for pain, using ice on the area and an ACE wrap to mild relief. He denies injury to the area, change in shoes, change in activity level, wound, joint swelling, numbness, or weakness.   Past Medical History  Diagnosis Date  . Hypertension    History reviewed. No pertinent past surgical history. History reviewed. No pertinent family history. Social History  Substance Use Topics  . Smoking status: Never Smoker   . Smokeless tobacco: None  . Alcohol Use: None    Review of Systems  Musculoskeletal: Positive for arthralgias. Negative for joint swelling.  Skin: Negative for wound.  Neurological: Negative for weakness and numbness.   Allergies  Review of patient's allergies indicates no known allergies.  Home Medications   Prior to Admission medications   Medication Sig Start Date End Date Taking? Authorizing Provider  diazepam (VALIUM) 5 MG tablet Take 1 tablet (5 mg total) by mouth 2 (two) times daily. 01/09/16   Melton KrebsSamantha Nicole Riley, PA-C  doxycycline (VIBRAMYCIN) 100 MG capsule Take 1 capsule (100 mg  total) by mouth 2 (two) times daily. 12/24/15   Roxy Horsemanobert Browning, PA-C  HYDROcodone-acetaminophen (NORCO/VICODIN) 5-325 MG tablet Take 1-2 tablets by mouth every 6 (six) hours as needed. 12/24/15   Roxy Horsemanobert Browning, PA-C  traMADol (ULTRAM) 50 MG tablet Take 1 tablet (50 mg total) by mouth every 6 (six) hours as needed. 01/09/16   Melton KrebsSamantha Nicole Riley, PA-C   BP 148/94 mmHg  Pulse 85  Temp(Src) 98.1 F (36.7 C) (Oral)  Resp 18  SpO2 100% Physical Exam  Constitutional: He is oriented to person, place, and time. He appears well-developed and well-nourished. No distress.  HENT:  Head: Normocephalic and atraumatic.  Mouth/Throat: Oropharynx is clear and moist. No oropharyngeal exudate.  Eyes: Conjunctivae and EOM are normal. Pupils are equal, round, and reactive to light.  Neck: Normal range of motion. Neck supple.  Musculoskeletal: Normal range of motion.  No obvious swelling, edema, trauma to the foot or ankle., No warmth to touch, minor tenderness to palpation of the medial aspect of the right foot about the navicular, no laxity in the foot, pedal pulses 2+, sensation intact. No signs of infection.  Neurological: He is alert and oriented to person, place, and time.  Skin: Skin is warm and dry.  Psychiatric: He has a normal mood and affect. His behavior is normal.    ED Course  Procedures (including critical care time) DIAGNOSTIC STUDIES: Oxygen Saturation is 97% on RA, normal by my interpretation.    COORDINATION OF CARE: 8:23 PM-Discussed treatment plan  which includes RICE techniques, ankle brace, and referral to orthopedics with pt at bedside and pt agreed to plan.    Labs Review Labs Reviewed - No data to display  Imaging Review No results found. I have personally reviewed and evaluated these images and lab results as part of my medical decision-making.   EKG Interpretation None      MDM  Labs:  Imaging:  Consults:  Therapeutics:  Discharge Meds:    Assessment/Plan: Patient presents with pain to the medial aspect of his ankle. No signs of infection, no signs of trauma, and certain etiology at this time. Likely overuse, very low suspicion for infectious, gouty arthritisI personally performed the services described in this documentation, which was scribed in my presence. The recorded information has been reviewed and is accurate.  Pain managed in ED. Pt advised to follow up with orthopedics if symptoms persist. Patient given brace while in ED, conservative therapy recommended and discussed. Patient will be dc home & is agreeable with above plan.  I personally performed the services described in this documentation, which was scribed in my presence. The recorded information has been reviewed and is accurate.  Final diagnoses:  Ankle pain, right         Eyvonne Mechanic, PA-C 02/29/16 2036  Arby Barrette, MD 03/13/16 [phone]

## 2016-02-29 NOTE — ED Notes (Signed)
Pt states that he has had atraumatic R ankle pain x 1 week. Hx of same 2 months ago. Unsure of reason. Alert and oriented.

## 2016-02-29 NOTE — Discharge Instructions (Signed)

## 2016-07-15 ENCOUNTER — Ambulatory Visit (INDEPENDENT_AMBULATORY_CARE_PROVIDER_SITE_OTHER): Payer: No Typology Code available for payment source | Admitting: Sports Medicine

## 2016-07-30 ENCOUNTER — Encounter (HOSPITAL_COMMUNITY): Payer: Self-pay | Admitting: Oncology

## 2016-07-30 ENCOUNTER — Emergency Department (HOSPITAL_COMMUNITY)
Admission: EM | Admit: 2016-07-30 | Discharge: 2016-07-30 | Disposition: A | Discharge status: Home / Self Care | Payer: No Typology Code available for payment source | Attending: Emergency Medicine | Admitting: Emergency Medicine

## 2016-07-30 DIAGNOSIS — L02811 Cutaneous abscess of head [any part, except face]: Secondary | ICD-10-CM | POA: Diagnosis not present

## 2016-07-30 DIAGNOSIS — L0211 Cutaneous abscess of neck: Secondary | ICD-10-CM | POA: Diagnosis present

## 2016-07-30 DIAGNOSIS — Z23 Encounter for immunization: Secondary | ICD-10-CM | POA: Insufficient documentation

## 2016-07-30 DIAGNOSIS — I10 Essential (primary) hypertension: Secondary | ICD-10-CM | POA: Diagnosis not present

## 2016-07-30 MED ORDER — DOXYCYCLINE HYCLATE 100 MG PO CAPS
100.0000 mg | ORAL_CAPSULE | Freq: Two times a day (BID) | ORAL | 0 refills | Status: DC
Start: 2016-07-30 — End: 2018-01-31

## 2016-07-30 MED ORDER — TETANUS-DIPHTH-ACELL PERTUSSIS 5-2.5-18.5 LF-MCG/0.5 IM SUSP
0.5000 mL | Freq: Once | INTRAMUSCULAR | Status: AC
  Administered 2016-07-30: 0.5 mL via INTRAMUSCULAR
  Filled 2016-07-30: qty 0.5

## 2016-07-30 MED ORDER — LIDOCAINE-EPINEPHRINE (PF) 2 %-1:200000 IJ SOLN
10.0000 mg | Freq: Once | INTRAMUSCULAR | Status: AC
  Administered 2016-07-30: 10 mg via INTRADERMAL

## 2016-07-30 MED ORDER — TRAMADOL HCL 50 MG PO TABS: 50.0000 mg | ORAL_TABLET | Freq: Four times a day (QID) | ORAL | 0 refills | Status: DC | PRN

## 2016-07-30 MED ORDER — LIDOCAINE-EPINEPHRINE (PF) 2 %-1:200000 IJ SOLN
INTRAMUSCULAR | Status: AC
  Filled 2016-07-30: qty 20

## 2016-07-30 NOTE — ED Provider Notes (Signed)
[facility]-EMERGENCY DEPT Provider Note   CSN: [id number] Arrival date & time: 07/30/16  0409     History   Chief Complaint Chief Complaint  Patient presents with  . Abscess    HPI Jacob Lloyd is a 30 y.o. male.  HPI   30 year old morbidly obese male with history of hypertension presenting for evaluation of an abscess behind the back of his neck. Patient report 4 days ago he noticed a small bump to the back of his neck. It has increased in size and become increasingly more painful. He described his pain as a sharp achy sensation, moderate intensity and has noticed small amount of drainage coming from it. He denies any associated itchiness. He thought it was an ingrown hair. No associated fever, severe headache, numbness, or rash. He has tried using alcohol wipes with minimal relief. He has had history of abscess in the past. He is unable to recall last tetanus status.  Past Medical History:  Diagnosis Date  . Hypertension     There are no active problems to display for this patient.   Past Surgical History:  Procedure Laterality Date  . FRACTURE SURGERY Left    ankle surgery       Home Medications    Prior to Admission medications   Medication Sig Start Date End Date Taking? Authorizing Provider  diazepam (VALIUM) 5 MG tablet Take 1 tablet (5 mg total) by mouth 2 (two) times daily. 01/09/16   Melton KrebsSamantha Nicole Riley, PA-C  doxycycline (VIBRAMYCIN) 100 MG capsule Take 1 capsule (100 mg total) by mouth 2 (two) times daily. 12/24/15   Roxy Horsemanobert Browning, PA-C  HYDROcodone-acetaminophen (NORCO/VICODIN) 5-325 MG tablet Take 1-2 tablets by mouth every 6 (six) hours as needed. 12/24/15   Roxy Horsemanobert Browning, PA-C  traMADol (ULTRAM) 50 MG tablet Take 1 tablet (50 mg total) by mouth every 6 (six) hours as needed. 01/09/16   Melton KrebsSamantha Nicole Riley, PA-C    Family History No family history on file.  Social History Social History  Substance Use Topics  . Smoking status: Never Smoker  .  Smokeless tobacco: Never Used  . Alcohol use Yes     Allergies   Review of patient's allergies indicates no known allergies.   Review of Systems Review of Systems  Constitutional: Negative for fever.  Musculoskeletal: Positive for neck pain.  Neurological: Negative for numbness and headaches.     Physical Exam Updated Vital Signs BP 140/93   Pulse 92   Temp 98.3 F (36.8 C) (Oral)   Resp 18   Ht 5\' 8"  (1.727 m)   Wt (!) 167.8 kg   SpO2 95%   BMI 56.26 kg/m   Physical Exam  Constitutional: He appears well-developed and well-nourished. No distress.  HENT:  Head: Atraumatic.  Eyes: Conjunctivae are normal.  Neck: Neck supple.  An area of induration and tenderness to the posterior neck superiorly with tenderness to palpation but without surrounding erythema.   Neurological: He is alert.  Skin: No rash noted.  Psychiatric: He has a normal mood and affect.  Nursing note and vitals reviewed.    ED Treatments / Results  Labs (all labs ordered are listed, but only abnormal results are displayed) Labs Reviewed - No data to display  EKG  EKG Interpretation None       Radiology No results found.  Procedures Procedures (including critical care time)  INCISION AND DRAINAGE Performed by: Fayrene HelperRAN,Delano Scardino Consent: Verbal consent obtained. Risks and benefits: risks, benefits and alternatives were discussed Type:  abscess  Body area: posterior scalp/neck  Anesthesia: local infiltration  Incision was made with a scalpel.  Local anesthetic: lidocaine 2% w epinephrine  Anesthetic total: 3 ml  Complexity: complex Blunt dissection to break up loculations  Drainage: purulent  Drainage amount: small  Packing material: none  Patient tolerance: Patient tolerated the procedure well with no immediate complications.     Medications Ordered in ED Medications - No data to display   Initial Impression / Assessment and Plan / ED Course  I have reviewed the  triage vital signs and the nursing notes.  Pertinent labs & imaging results that were available during my care of the patient were reviewed by me and considered in my medical decision making (see chart for details).  Clinical Course    BP 140/93   Pulse 92   Temp 98.3 F (36.8 C) (Oral)   Resp 18   Ht 5\' 8"  (1.727 m)   Wt (!) 167.8 kg   SpO2 95%   BMI 56.26 kg/m    Final Clinical Impressions(s) / ED Diagnoses   Final diagnoses:  Scalp abscess    New Prescriptions New Prescriptions   No medications on file   7:27 AM Patient has an early forming abscess noted to the posterior neck in the scalp which will require consistent drainage.  8:50 AM Successful I&D.  Pt home with doxy and tramadol.  Wound care provided.  Return precaution discussed   Fayrene HelperBowie Davon Abdelaziz, PA-C 07/30/16 [phone]    Canary Brimhristopher J Tegeler, MD 07/30/16 [phone]

## 2016-07-30 NOTE — Discharge Instructions (Signed)
Return for wound check in 48 hrs if no improvement or if your condition worsen.

## 2016-07-30 NOTE — ED Triage Notes (Signed)
Pt has an abscess to the back of his neck.  Area is hard and painful.  Pt states he has had an abscess in this area before that required an I&D.  Rates pain 7/10, throbbing in nature.

## 2017-03-07 IMAGING — CR DG ANKLE COMPLETE 3+V*R*
3 series · 3 of 3 positions shown · non-contrast
Comparison: None.

CLINICAL DATA: Right ankle pain since this morning after getting
out of bed. No specific injury.

EXAM:
RIGHT ANKLE - COMPLETE 3+ VIEW

[x ankle ap right]
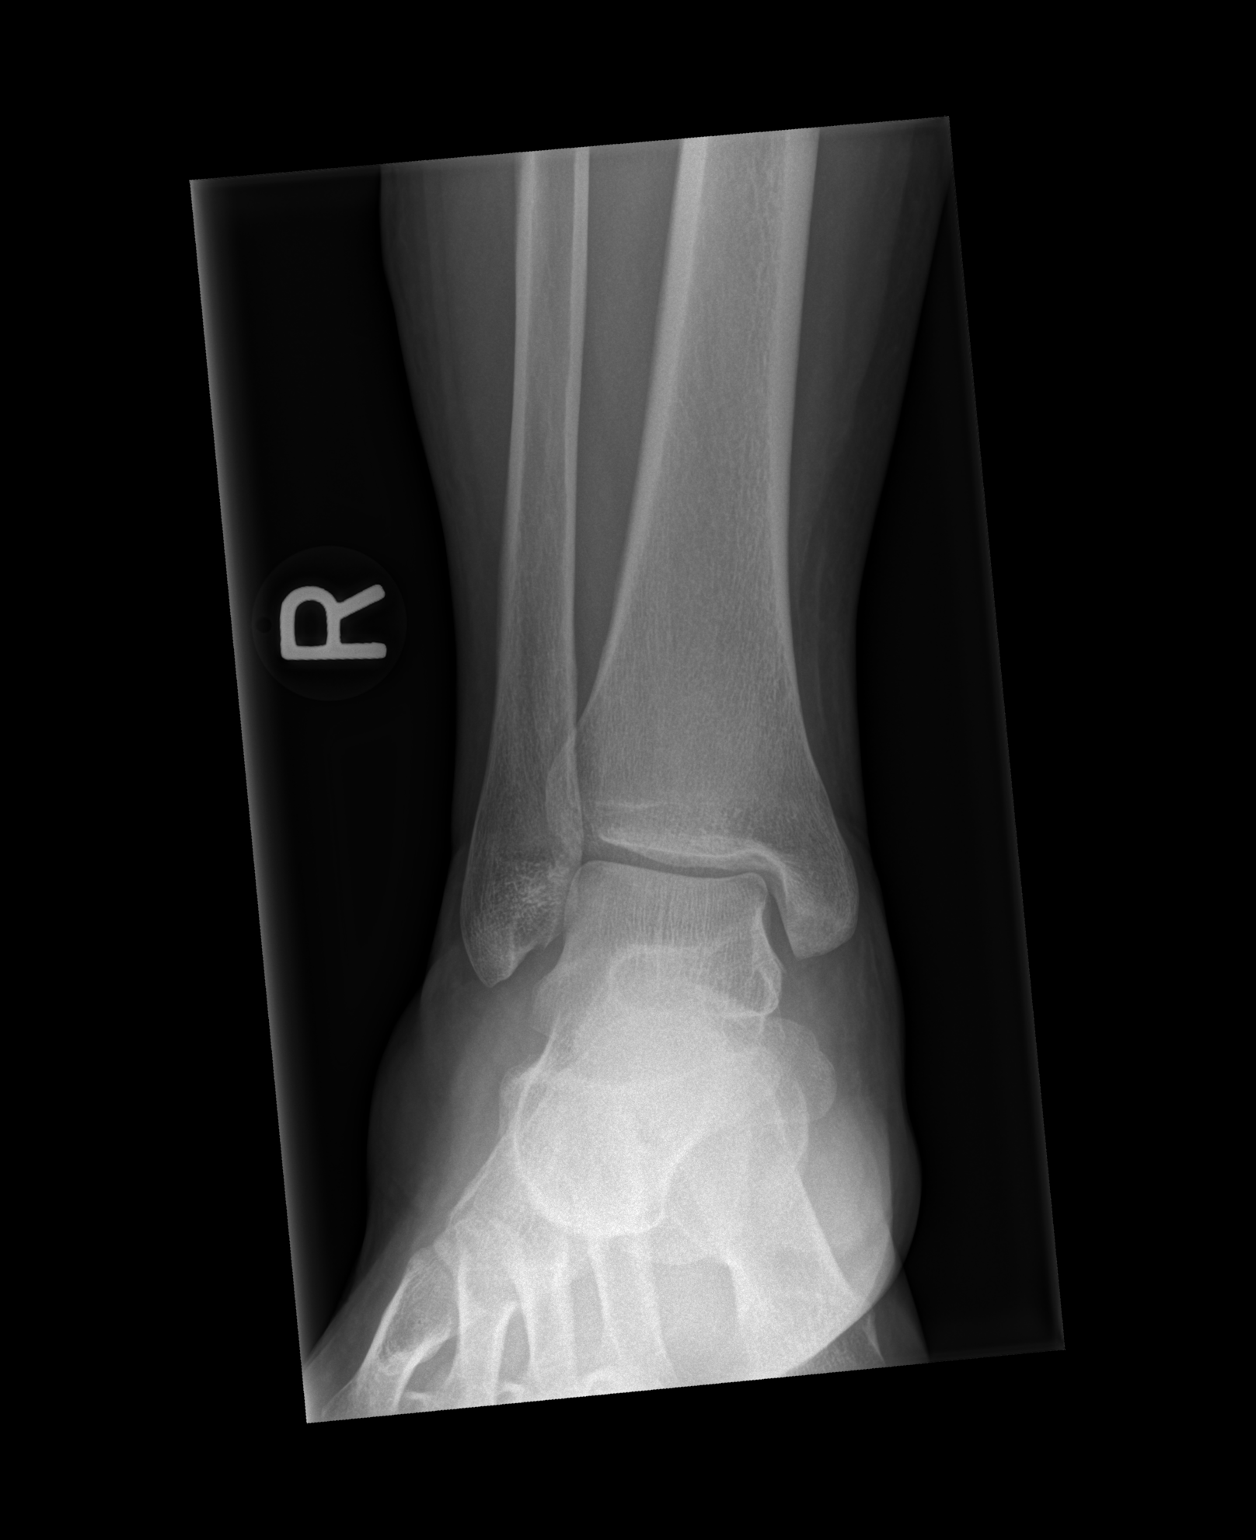

[x ankle obl right]
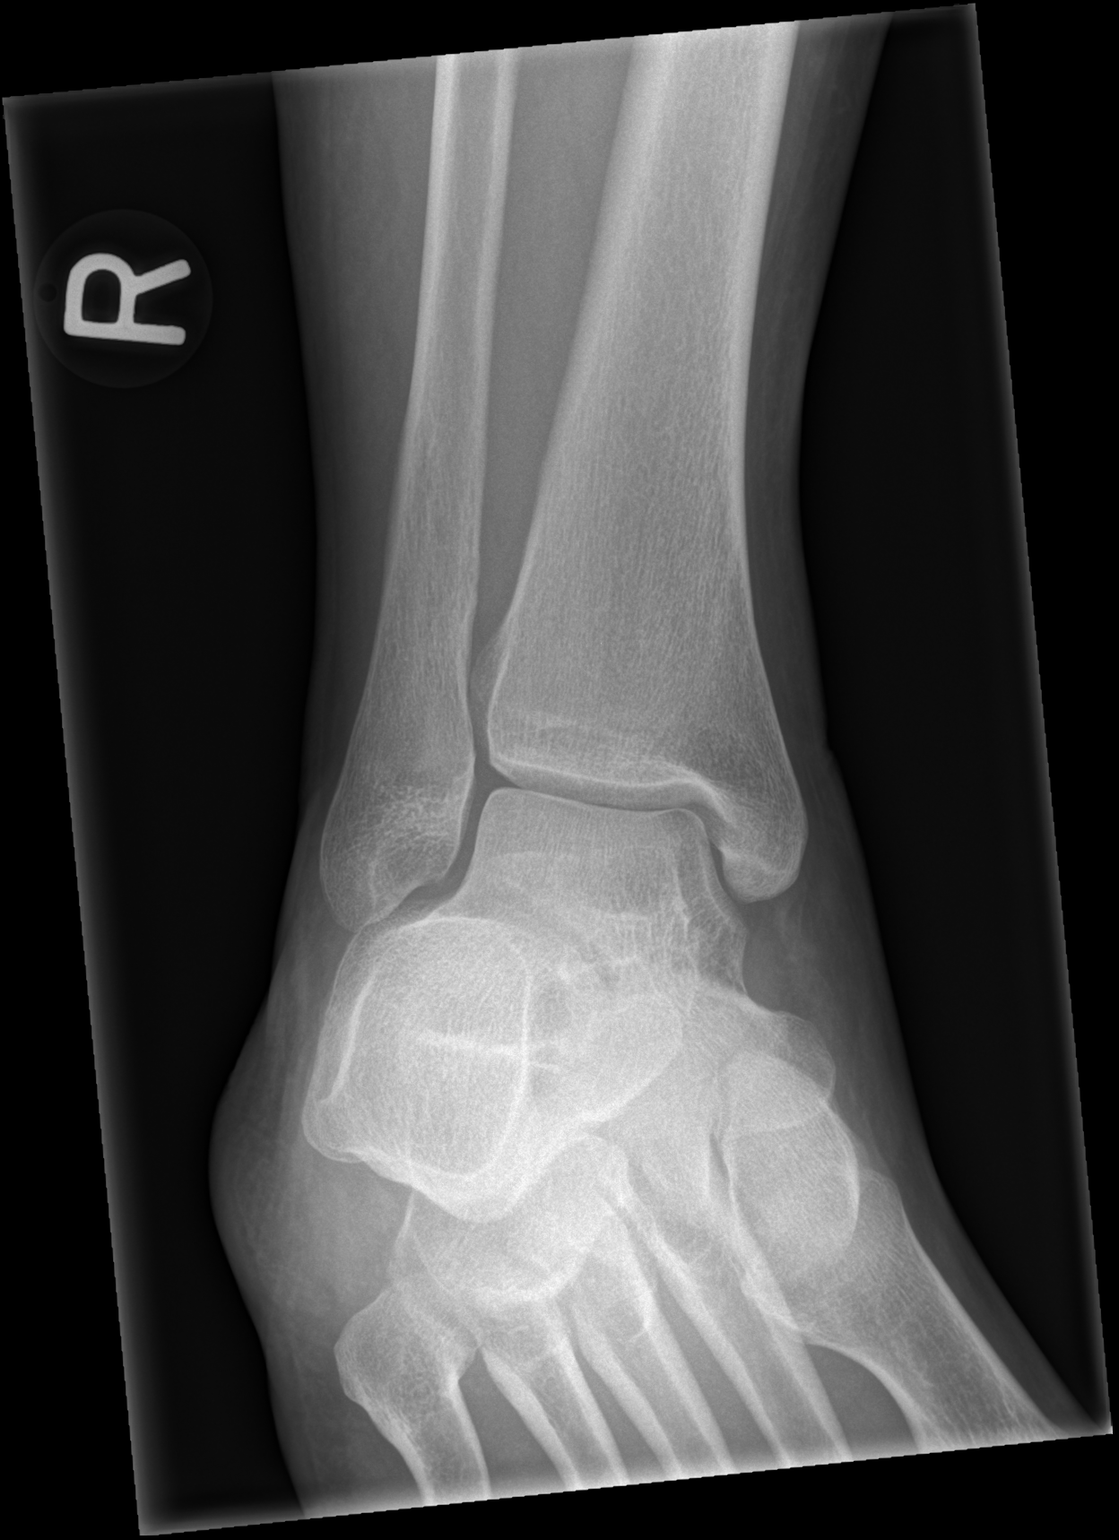

[x ankle lat right]
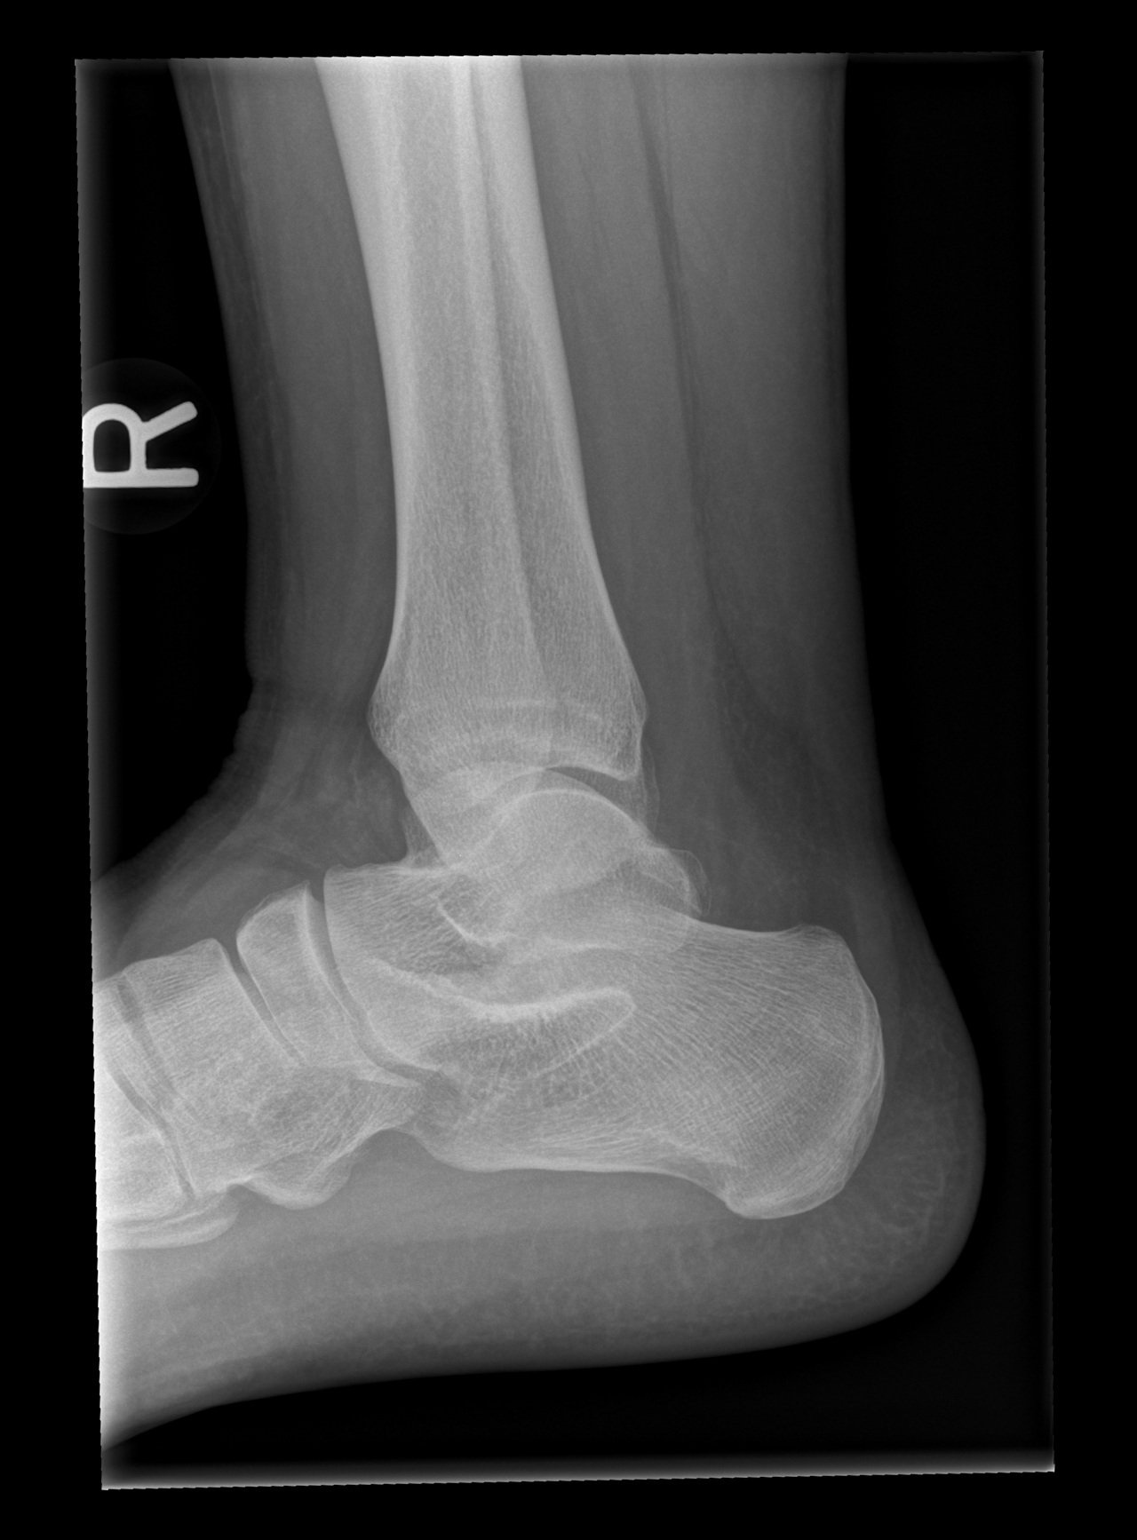

[3 of 3 positions shown; findings below may reference images not displayed]

FINDINGS: There is no evidence of fracture, dislocation, or joint effusion.
There is no evidence of arthropathy or other focal bone abnormality.
Soft tissues are unremarkable.
IMPRESSION: Normal examination.

## 2018-01-31 ENCOUNTER — Ambulatory Visit (INDEPENDENT_AMBULATORY_CARE_PROVIDER_SITE_OTHER): Payer: No Typology Code available for payment source | Admitting: Physician Assistant

## 2018-01-31 ENCOUNTER — Other Ambulatory Visit: Payer: Self-pay

## 2018-01-31 ENCOUNTER — Encounter: Payer: Self-pay | Admitting: Physician Assistant

## 2018-01-31 VITALS — BP 146/100 | HR 89 | Temp 97.6°F | Ht 68.0 in | Wt 378.0 lb

## 2018-01-31 DIAGNOSIS — G4733 Obstructive sleep apnea (adult) (pediatric): Secondary | ICD-10-CM | POA: Diagnosis not present

## 2018-01-31 DIAGNOSIS — H6501 Acute serous otitis media, right ear: Secondary | ICD-10-CM | POA: Diagnosis not present

## 2018-01-31 DIAGNOSIS — I158 Other secondary hypertension: Secondary | ICD-10-CM | POA: Diagnosis not present

## 2018-01-31 MED ORDER — AMOXICILLIN 500 MG PO CAPS: 1000.0000 mg | ORAL_CAPSULE | Freq: Two times a day (BID) | ORAL | 0 refills | Status: DC

## 2018-01-31 MED ORDER — CHLORTHALIDONE 25 MG PO TABS: 12.5000 mg | ORAL_TABLET | Freq: Every day | ORAL | 0 refills | Status: DC

## 2018-01-31 NOTE — Patient Instructions (Signed)
I am referring you to a sleep doctor.  Please make sure that she keep your phone on and check all of your voicemail messages.  I am starting you on a medium dose of blood pressure medication that will help you get some fluid off.  Please take half of the pill daily.  I would like to see you back in about 1 to 2 months for blood pressure recheck.  I am starting you on an antibiotic for your ear complaint.  I am referring you to a weight loss clinic.  I look forward to seeing your progress.

## 2018-01-31 NOTE — Progress Notes (Signed)
01/31/2018 10:46 AM   DOB: [birth year] / MRN: [id number]  SUBJECTIVE:  Jacob Lloyd is a 32 y.o. male presenting to establish care.  Patient has a history of sleep apnea along with super morbid obesity.  He has a history of hypertension and has taken lisinopril in the past.  He does wear sleep apnea mask but feels that this needs to be adjusted.  He is requesting a new referral to a sleep apnea specialist.  He denies chest pain and shortness of breath today.  He is a former smoker quit 3 years ago.  He tells me he has always struggled with his weight and has been gaining weight lately.  The last time he lost a significant amount of weight was after sleep apnea treatment.  He complains of right sided ear pressure and pain times 36 hours.  Feels his symptoms are worsening.  Does associated decrease in his ability to hear.  He has No Known Allergies.   He  has a past medical history of Hypertension and Sleep apnea.    He  reports that he quit smoking about 3 years ago. His smoking use included cigarettes. He has never used smokeless tobacco. He reports that he drinks alcohol. He reports that he does not use drugs. He  reports that he currently engages in sexual activity. He reports using the following method of birth control/protection: None. The patient  has a past surgical history that includes Fracture surgery (Left) and Eye surgery.  His family history includes Cancer in his maternal grandmother; Heart disease in his maternal grandfather and maternal grandmother; Hyperlipidemia in his maternal grandfather and maternal grandmother; Hypertension in his maternal grandfather, maternal grandmother, and mother.  Review of Systems  Constitutional: Negative for fever.  HENT: Positive for ear pain and hearing loss. Negative for congestion, ear discharge, nosebleeds, sinus pain, sore throat and tinnitus.   Respiratory: Negative for stridor.   Skin: Negative for itching and rash.  Neurological:  Negative for dizziness.    The problem list and medications were reviewed and updated by myself where necessary and exist elsewhere in the encounter.   OBJECTIVE:  BP (!) 146/100 (BP Location: Left Arm)   Pulse 89   Temp 97.6 F (36.4 C) (Oral)   Ht  (1.727 m)   Wt (!) 378 lb (171.5 kg)   SpO2 96%   BMI 57.47 kg/m   Physical Exam  Constitutional: He is oriented to person, place, and time. He appears well-developed. He does not appear ill.  Morbidly obese male.   HENT:  Ears:  Eyes: Pupils are equal, round, and reactive to light. Conjunctivae and EOM are normal.  Cardiovascular: Normal rate, regular rhythm, S1 normal, S2 normal, normal heart sounds, intact distal pulses and normal pulses. Exam reveals no gallop and no friction rub.  No murmur heard. Pulmonary/Chest: Effort normal. No stridor. No respiratory distress. He has no wheezes. He has no rales.  Abdominal: He exhibits no distension.  Musculoskeletal: Normal range of motion. He exhibits no edema.  Neurological: He is alert and oriented to person, place, and time. No cranial nerve deficit. Coordination normal.  Skin: Skin is warm and dry. He is not diaphoretic.  Psychiatric: He has a normal mood and affect.  Nursing note and vitals reviewed.     No results found for this or any previous visit (from the past 72 hour(s)).  No results found.  ASSESSMENT AND PLAN:  Jr was seen today for establish care.  Diagnoses  and all orders for this visit:  Other secondary hypertension: We will recheck him in about 1 to 2 months. -     Hemoglobin A1c -     Basic metabolic panel -     Lipid panel -     TSH -     chlorthalidone (HYGROTON) 25 MG tablet; Take 0.5 tablets (12.5 mg total) by mouth daily.  Morbid obesity (HCC) -     Amb Ref to Medical Weight Management  Obstructive sleep apnea syndrome -     Ambulatory referral to Neurology  Non-recurrent acute serous otitis media of right ear -     amoxicillin  (AMOXIL) 500 MG capsule; Take 2 capsules (1,000 mg total) by mouth 2 (two) times daily.    The patient is advised to call or return to clinic if he does not see an improvement in symptoms, or to seek the care of the closest emergency department if he worsens with the above plan.   Deliah Boston, MHS, PA-C Primary Care at [facility] 01/31/2018 10:46 AM

## 2018-02-01 LAB — LIPID PANEL
Chol/HDL Ratio: 3.7 ratio (ref 0.0–5.0)
Cholesterol, Total: 141 mg/dL (ref 100–199)
HDL: 38 mg/dL — AB (ref >39)
LDL Calculated: 81 mg/dL (ref 0–99)
Triglycerides: 109 mg/dL (ref 0–149)
VLDL CHOLESTEROL CAL: 22 mg/dL (ref 5–40)

## 2018-02-01 LAB — BASIC METABOLIC PANEL
BUN/Creatinine Ratio: 14 (ref 9–20)
BUN: 14 mg/dL (ref 6–20)
CALCIUM: 9.6 mg/dL (ref 8.7–10.2)
CHLORIDE: 104 mmol/L (ref 96–106)
CO2: 24 mmol/L (ref 20–29)
Creatinine, Ser: 1.02 mg/dL (ref 0.76–1.27)
GFR, EST AFRICAN AMERICAN: 113 mL/min/{1.73_m2} (ref >59)
GFR, EST NON AFRICAN AMERICAN: 97 mL/min/{1.73_m2} (ref >59)
Glucose: 92 mg/dL (ref 65–99)
Potassium: 4.4 mmol/L (ref 3.5–5.2)
Sodium: 142 mmol/L (ref 134–144)

## 2018-02-01 LAB — HEMOGLOBIN A1C
ESTIMATED AVERAGE GLUCOSE: 91 mg/dL
Hgb A1c MFr Bld: 4.8 % (ref 4.8–5.6)

## 2018-02-01 LAB — TSH: TSH: 2.13 u[IU]/mL (ref 0.450–4.500)

## 2018-02-22 ENCOUNTER — Encounter (INDEPENDENT_AMBULATORY_CARE_PROVIDER_SITE_OTHER): Payer: No Typology Code available for payment source

## 2018-03-12 ENCOUNTER — Encounter (INDEPENDENT_AMBULATORY_CARE_PROVIDER_SITE_OTHER): Payer: Self-pay

## 2018-03-12 ENCOUNTER — Ambulatory Visit (INDEPENDENT_AMBULATORY_CARE_PROVIDER_SITE_OTHER): Payer: No Typology Code available for payment source | Admitting: Family Medicine

## 2018-03-13 ENCOUNTER — Ambulatory Visit (INDEPENDENT_AMBULATORY_CARE_PROVIDER_SITE_OTHER): Payer: No Typology Code available for payment source | Admitting: Family Medicine

## 2018-03-13 ENCOUNTER — Encounter (INDEPENDENT_AMBULATORY_CARE_PROVIDER_SITE_OTHER): Payer: Self-pay | Admitting: Family Medicine

## 2018-03-13 VITALS — BP 148/96 | HR 82 | Temp 98.4°F | Ht 68.0 in | Wt 377.0 lb

## 2018-03-13 DIAGNOSIS — Z9189 Other specified personal risk factors, not elsewhere classified: Secondary | ICD-10-CM

## 2018-03-13 DIAGNOSIS — Z0289 Encounter for other administrative examinations: Secondary | ICD-10-CM

## 2018-03-13 DIAGNOSIS — R5383 Other fatigue: Secondary | ICD-10-CM | POA: Insufficient documentation

## 2018-03-13 DIAGNOSIS — Z6841 Body Mass Index (BMI) 40.0 and over, adult: Secondary | ICD-10-CM

## 2018-03-13 DIAGNOSIS — I10 Essential (primary) hypertension: Secondary | ICD-10-CM

## 2018-03-13 DIAGNOSIS — Z1331 Encounter for screening for depression: Secondary | ICD-10-CM

## 2018-03-13 DIAGNOSIS — R0602 Shortness of breath: Secondary | ICD-10-CM | POA: Insufficient documentation

## 2018-03-13 NOTE — Progress Notes (Signed)
Office: [phone]  /  Fax: [phone]   Dear Casimiro Needle L. Chestine Spore, PA-C,   Thank you for referring Jacob Lloyd to our clinic. The following note includes my evaluation and treatment recommendations.  HPI:   Chief Complaint: OBESITY    Jacob Lloyd has been referred by Casimiro Needle L. Chestine Spore, PA-C for consultation regarding his obesity and obesity related comorbidities.    Jacob Lloyd (MR# [id number]) is a 32 y.o. male who presents on 03/13/2018 for obesity evaluation and treatment. Current BMI is Body mass index is 57.32 kg/m.Marland Kitchen Jacob Lloyd has been struggling with his weight for many years and has been unsuccessful in either losing weight, maintaining weight loss, or reaching his healthy weight goal.     Jacob Lloyd attended our information session and states he is currently in the action stage of change and ready to dedicate time achieving and maintaining a healthier weight. Jacob Lloyd is interested in becoming our patient and working on intensive lifestyle modifications including (but not limited to) diet, exercise and weight loss.    Jacob Lloyd states his family eats meals together he thinks his family will eat healthier with  him his desired weight loss is 100 lbs he has been heavy most of  his life he started gaining weight after high school his heaviest weight ever was 428 lbs. he has significant food cravings issues  he snacks frequently in the evenings he skips meals frequently he is frequently drinking liquids with calories he frequently makes poor food choices he frequently eats larger portions than normal  he has binge eating behaviors he struggles with emotional eating    Fatigue Jacob Lloyd feels his energy is lower than it should be. This has worsened with weight gain and has not worsened recently. Jacob Lloyd admits to daytime somnolence and  admits to waking up still tired. Patient has a diagnosis of sleep apnea which may be contributing to fatigue. Patent has a history of symptoms of  daytime fatigue, morning fatigue, morning headache and hypertension. Patient generally gets 6 hours of sleep per night, and states they generally have restful sleep with CPAP use. Snoring is present. Apneic episodes are present. Epworth Sleepiness Score is 3  EKG was ordered today and shows low voltage.  Dyspnea on exertion Jacob Lloyd notes increasing shortness of breath with exercising and seems to be worsening over time with weight gain. He notes getting out of breath sooner with activity than he used to. This has not gotten worse recently. EKG was ordered today and shows low voltage. Jacob Lloyd denies orthopnea.  Hypertension Jacob Lloyd is a 32 y.o. male with hypertension. His blood Lloyd is slightly elevated today at 148/96. He is only occasionally taking his medication. Jacob Lloyd denies chest pain or shortness of breath on exertion. He is attempting to work on weight loss to help control his blood Lloyd with the goal of decreasing his risk of heart attack and stroke. Jacob Lloyd is not currently controlled.  At risk for cardiovascular disease Jacob Lloyd is at a higher than average risk for cardiovascular disease due to obesity and hypertension. He currently denies any chest pain.  Depression Screen Jacob Lloyd Food and Mood (modified PHQ-9) score was  Depression screen PHQ 2/9 03/13/2018  Decreased Interest 1  Down, Depressed, Hopeless 0  PHQ - 2 Score 1  Altered sleeping 3  Tired, decreased energy 2  Change in appetite 0  Feeling bad or failure about yourself  0  Trouble concentrating 2  Moving slowly or fidgety/restless 1  Suicidal thoughts 0  PHQ-9 Score 9  Difficult doing work/chores Not difficult at all    ALLERGIES: No Known Allergies  MEDICATIONS: Current Outpatient Medications on File Prior to Visit  Medication Sig Dispense Refill  . chlorthalidone (HYGROTON) 25 MG tablet Take 0.5 tablets (12.5 mg total) by mouth daily. 90 tablet 0   No current  facility-administered medications on file prior to visit.     PAST MEDICAL HISTORY: Past Medical History:  Diagnosis Date  . Hypertension   . Sleep apnea     PAST SURGICAL HISTORY: Past Surgical History:  Procedure Laterality Date  . EYE SURGERY    . FRACTURE SURGERY Left    ankle surgery  . TONSILLECTOMY  2005    SOCIAL HISTORY: Social History   Tobacco Use  . Smoking status: Former Smoker    Types: Cigarettes    Last attempt to quit: 2016    Years since quitting: 3.4  . Smokeless tobacco: Never Used  Substance Use Topics  . Alcohol use: Yes    Comment: WEEKENDS  . Drug use: No    FAMILY HISTORY: Family History  Problem Relation Age of Onset  . Hypertension Mother   . Depression Mother   . Cancer Maternal Grandmother   . Heart disease Maternal Grandmother   . Hyperlipidemia Maternal Grandmother   . Hypertension Maternal Grandmother   . Hypertension Maternal Grandfather   . Hyperlipidemia Maternal Grandfather   . Heart disease Maternal Grandfather     ROS: Review of Systems  Constitutional: Positive for malaise/fatigue.  Eyes:       Wear Glasses or Contacts  Respiratory: Positive for shortness of breath (with activity).   Cardiovascular: Negative for chest pain and orthopnea.    PHYSICAL EXAM: Blood Lloyd (!) 148/96, pulse 82, temperature 98.4 F (36.9 C), temperature source Oral, height 5\' 8"  (1.727 m), weight (!) 377 lb (171 kg), SpO2 98 %. Body mass index is 57.32 kg/m. Physical Exam  Constitutional: He is oriented to person, place, and time. He appears well-developed and well-nourished.  HENT:  Head: Normocephalic and atraumatic.  Nose: Nose normal.  Eyes: EOM are normal. No scleral icterus.  Neck: Normal range of motion. Neck supple. No thyromegaly present.  Cardiovascular: Normal rate and regular rhythm.  Pulmonary/Chest: Effort normal. No respiratory distress.  Abdominal: Soft. There is no tenderness.  + obesity  Musculoskeletal:  Normal range of motion.  Range of Motion normal in all 4 extremities  Neurological: He is alert and oriented to person, place, and time. Coordination normal.  Skin: Skin is warm and dry.  Psychiatric: He has a normal mood and affect. His behavior is normal.  Vitals reviewed.   RECENT LABS AND TESTS: BMET    Component Value Date/Time   NA 142 01/31/2018 1047   K 4.4 01/31/2018 1047   CL 104 01/31/2018 1047   CO2 24 01/31/2018 1047   GLUCOSE 92 01/31/2018 1047   BUN 14 01/31/2018 1047   CREATININE 1.02 01/31/2018 1047   CALCIUM 9.6 01/31/2018 1047   GFRNONAA 97 01/31/2018 1047   GFRAA 113 01/31/2018 1047   Lab Results  Component Value Date   HGBA1C 4.8 01/31/2018   No results found for: INSULIN CBC No results found for: WBC, RBC, HGB, HCT, PLT, MCV, MCH, MCHC, RDW, LYMPHSABS, MONOABS, EOSABS, BASOSABS Iron/TIBC/Ferritin/ %Sat No results found for: IRON, TIBC, FERRITIN, IRONPCTSAT Lipid Panel     Component Value Date/Time   CHOL 141 01/31/2018 1047   TRIG 109 01/31/2018 1047   HDL 38 (  L) 01/31/2018 1047   CHOLHDL 3.7 01/31/2018 1047   LDLCALC 81 01/31/2018 1047   Hepatic Function Panel  No results found for: PROT, ALBUMIN, AST, ALT, ALKPHOS, BILITOT, BILIDIR, IBILI    Component Value Date/Time   TSH 2.130 01/31/2018 1047   Vitamin D There are no recent lab results  ECG  shows NSR with a rate of 86 BPM INDIRECT CALORIMETER done today shows a VO2 of 388 and a REE of 2705.  His calculated basal metabolic rate is 1610 thus his basal metabolic rate is worse than expected.    ASSESSMENT AND PLAN: Other fatigue - Plan: EKG 12-Lead, CBC With Differential, Insulin, random, VITAMIN D 25 Hydroxy (Vit-D Deficiency, Fractures), Vitamin B12, Folate, T3, T4, free, TSH  Shortness of breath on exertion - Plan: CBC With Differential  Essential hypertension  Depression screening  At risk for heart disease  Class 3 severe obesity with serious comorbidity and body mass index  (BMI) of 50.0 to 59.9 in adult, unspecified obesity type (HCC)  PLAN: Fatigue Joanthan was informed that his fatigue may be related to obesity, depression or many other causes. Labs will be ordered, and in the meanwhile Jhayden has agreed to work on diet, exercise and weight loss to help with fatigue. Proper sleep hygiene was discussed including the need for 7-8 hours of quality sleep each night. A sleep study was not ordered based on symptoms and Epworth score. We will order indirect calorimetry today.  Dyspnea on exertion Bastien's shortness of breath appears to be obesity related and exercise induced. He has agreed to work on weight loss and gradually increase exercise to treat his exercise induced shortness of breath. If Kolyn follows our instructions and loses weight without improvement of his shortness of breath, we will plan to refer to pulmonology. We will order labs and indirect calorimetry today and will monitor this condition regularly. Elihue agrees to this plan.  Hypertension We discussed sodium restriction, working on healthy weight loss, and a regular exercise program as the means to achieve improved blood Lloyd control. Saul agreed with this plan and agreed to follow up as directed. We will follow up at the next visit and will continue to monitor his blood Lloyd as well as his progress with the above lifestyle modifications. He will continue his medications as prescribed and will watch for signs of hypotension as he continues his lifestyle modifications.  Cardiovascular risk counseling Jamarl was given extended (15 minutes) coronary artery disease prevention counseling today. He is 32 y.o. male and has risk factors for heart disease including obesity and hypertension. We discussed intensive lifestyle modifications today with an emphasis on specific weight loss instructions and strategies. Pt was also informed of the importance of increasing exercise and decreasing saturated fats to help  prevent heart disease.  Depression Screen Joh had a mildly positive depression screening. Depression is commonly associated with obesity and often results in emotional eating behaviors. We will monitor this closely and work on CBT to help improve the non-hunger eating patterns. Referral to Psychology may be required if no improvement is seen as he continues in our clinic.  Obesity Raoul is currently in the action stage of change and his goal is to continue with weight loss efforts. I recommend Tiago begin the structured treatment plan as follows:  He has agreed to follow the Category 4 plan Tremond has been instructed to eventually work up to a goal of 150 minutes of combined cardio and strengthening exercise per week for weight  loss and overall health benefits. We discussed the following Behavioral Modification Strategies today: better snacking choices, planning for success, increasing lean protein intake, increasing vegetables and work on meal planning and easy cooking plans   He was informed of the importance of frequent follow up visits to maximize his success with intensive lifestyle modifications for his multiple health conditions. He was informed we would discuss his lab results at his next visit unless there is a critical issue that needs to be addressed sooner. Bates agreed to keep his next visit at the agreed upon time to discuss these results.    OBESITY BEHAVIORAL INTERVENTION VISIT  Today's visit was # 1 out of 22.  Starting weight: 377 lbs Starting date: 03/13/18 Today's weight : 377 lbs  Today's date: 03/13/2018 Total lbs lost to date: 0 (Patients must lose 7 lbs in the first 6 months to continue with counseling)   ASK: We discussed the diagnosis of obesity with Jacob Lloyd today and Borden agreed to give [place] permission to discuss obesity behavioral modification therapy today.  ASSESS: Orie has the diagnosis of obesity and his BMI today is 37.34 Finnick is in the  action stage of change   ADVISE: Dasan was educated on the multiple health risks of obesity as well as the benefit of weight loss to improve his health. He was advised of the need for long term treatment and the importance of lifestyle modifications.  AGREE: Multiple dietary modification options and treatment options were discussed and  Kaevon agreed to the above obesity treatment plan.   I, [place] Crane, am acting as transcriptionist for  Filbert Schilder, MD   I have reviewed the above documentation for accuracy and completeness, and I agree with the above. - Debbra Riding, MD

## 2018-03-14 ENCOUNTER — Other Ambulatory Visit: Payer: Self-pay

## 2018-03-14 ENCOUNTER — Ambulatory Visit (INDEPENDENT_AMBULATORY_CARE_PROVIDER_SITE_OTHER): Payer: No Typology Code available for payment source | Admitting: Physician Assistant

## 2018-03-14 ENCOUNTER — Encounter: Payer: Self-pay | Admitting: Physician Assistant

## 2018-03-14 VITALS — BP 136/82 | HR 88 | Temp 98.1°F | Resp 18 | Ht 68.0 in | Wt 378.6 lb

## 2018-03-14 DIAGNOSIS — I159 Secondary hypertension, unspecified: Secondary | ICD-10-CM

## 2018-03-14 DIAGNOSIS — R748 Abnormal levels of other serum enzymes: Secondary | ICD-10-CM | POA: Diagnosis not present

## 2018-03-14 DIAGNOSIS — R000 Tachycardia, unspecified: Secondary | ICD-10-CM | POA: Insufficient documentation

## 2018-03-14 DIAGNOSIS — E559 Vitamin D deficiency, unspecified: Secondary | ICD-10-CM | POA: Diagnosis not present

## 2018-03-14 LAB — CBC WITH DIFFERENTIAL
BASOS ABS: 0 10*3/uL (ref 0.0–0.2)
Basos: 0 %
EOS (ABSOLUTE): 0.1 10*3/uL (ref 0.0–0.4)
Eos: 1 %
HEMOGLOBIN: 12.8 g/dL — AB (ref 13.0–17.7)
Hematocrit: 40.3 % (ref 37.5–51.0)
Immature Grans (Abs): 0 10*3/uL (ref 0.0–0.1)
Immature Granulocytes: 0 %
LYMPHS ABS: 2.9 10*3/uL (ref 0.7–3.1)
Lymphs: 26 %
MCH: 29.6 pg (ref 26.6–33.0)
MCHC: 31.8 g/dL (ref 31.5–35.7)
MCV: 93 fL (ref 79–97)
Monocytes Absolute: 0.6 10*3/uL (ref 0.1–0.9)
Monocytes: 5 %
NEUTROS PCT: 68 %
Neutrophils Absolute: 7.3 10*3/uL — ABNORMAL HIGH (ref 1.4–7.0)
RBC: 4.33 x10E6/uL (ref 4.14–5.80)
RDW: 13.1 % (ref 12.3–15.4)
WBC: 10.9 10*3/uL — ABNORMAL HIGH (ref 3.4–10.8)

## 2018-03-14 LAB — T4, FREE: Free T4: 1.35 ng/dL (ref 0.82–1.77)

## 2018-03-14 LAB — VITAMIN B12: Vitamin B-12: 276 pg/mL (ref 232–1245)

## 2018-03-14 LAB — FOLATE: Folate: 3.7 ng/mL (ref >3.0)

## 2018-03-14 LAB — TSH: TSH: 3.01 u[IU]/mL (ref 0.450–4.500)

## 2018-03-14 LAB — INSULIN, RANDOM: INSULIN: 14.4 u[IU]/mL (ref 2.6–24.9)

## 2018-03-14 LAB — T3: T3 TOTAL: 145 ng/dL (ref 71–180)

## 2018-03-14 LAB — VITAMIN D 25 HYDROXY (VIT D DEFICIENCY, FRACTURES): Vit D, 25-Hydroxy: 8.9 ng/mL — ABNORMAL LOW (ref 30.0–100.0)

## 2018-03-14 MED ORDER — CHLORTHALIDONE 25 MG PO TABS: 12.5000 mg | ORAL_TABLET | Freq: Every day | ORAL | 0 refills | Status: DC

## 2018-03-14 MED ORDER — VITAMIN D (ERGOCALCIFEROL) 1.25 MG (50000 UNIT) PO CAPS: 50000.0000 [IU] | ORAL_CAPSULE | ORAL | 2 refills | Status: DC

## 2018-03-14 NOTE — Progress Notes (Signed)
03/14/2018 8:30 AM   DOB: 29-Jun-1986 / MRN: [id number]  SUBJECTIVE:  Jacob Lloyd is a 32 y.o. male presenting for recheck HTN.  He feels well today and denies complaint.  Is going to weight management and first appointment was yesterday where labs were drawn and all came back within normal limits.  He feels well today and denies any complaints.  He is awaiting a sleep apnea referral which will take place in July.   He has No Known Allergies.   He  has a past medical history of Hypertension and Sleep apnea.    He  reports that he quit smoking about 3 years ago. His smoking use included cigarettes. He has never used smokeless tobacco. He reports that he drinks alcohol. He reports that he does not use drugs. He  reports that he currently engages in sexual activity. He reports using the following method of birth control/protection: None. The patient  has a past surgical history that includes Fracture surgery (Left); Eye surgery; and Tonsillectomy (2005).  His family history includes Cancer in his maternal grandmother; Depression in his mother; Heart disease in his maternal grandfather and maternal grandmother; Hyperlipidemia in his maternal grandfather and maternal grandmother; Hypertension in his maternal grandfather, maternal grandmother, and mother.  Review of Systems  Constitutional: Negative for chills, diaphoresis and fever.  Eyes: Negative.   Respiratory: Negative for cough, hemoptysis, sputum production, shortness of breath and wheezing.   Cardiovascular: Negative for chest pain, orthopnea and leg swelling.  Gastrointestinal: Negative for abdominal pain, blood in stool, constipation, diarrhea, heartburn, melena, nausea and vomiting.  Genitourinary: Negative for dysuria, flank pain, frequency, hematuria and urgency.  Skin: Negative for rash.  Neurological: Negative for dizziness, sensory change, speech change, focal weakness and headaches.    The problem list and medications were  reviewed and updated by myself where necessary and exist elsewhere in the encounter.   OBJECTIVE:  BP 136/82 (BP Location: Right Arm, Patient Position: Sitting, Cuff Size: Large)   Pulse 88   Temp 98.1 F (36.7 C) (Oral)   Resp 18   Ht 5\' 8"  (1.727 m)   Wt (!) 378 lb 9.6 oz (171.7 kg)   SpO2 95%   BMI 57.57 kg/m   Wt Readings from Last 3 Encounters:  03/14/18 (!) 378 lb 9.6 oz (171.7 kg)  03/13/18 (!) 377 lb (171 kg)  01/31/18 (!) 378 lb (171.5 kg)   Temp Readings from Last 3 Encounters:  03/14/18 98.1 F (36.7 C) (Oral)  03/13/18 98.4 F (36.9 C) (Oral)  01/31/18 97.6 F (36.4 C) (Oral)   BP Readings from Last 3 Encounters:  03/14/18 136/82  03/13/18 (!) 148/96  01/31/18 (!) 146/100   Pulse Readings from Last 3 Encounters:  03/14/18 88  03/13/18 82  01/31/18 89     Physical Exam  Constitutional: He is oriented to person, place, and time. He appears well-developed. He is active.  Non-toxic appearance. He does not appear ill.  Eyes: Pupils are equal, round, and reactive to light. Conjunctivae and EOM are normal.  Cardiovascular: Normal rate, regular rhythm, S1 normal, S2 normal, normal heart sounds, intact distal pulses and normal pulses. Exam reveals no gallop and no friction rub.  No murmur heard. Pulmonary/Chest: Effort normal. No stridor. No respiratory distress. He has no wheezes. He has no rales.  Abdominal: He exhibits no distension.  Musculoskeletal: Normal range of motion. He exhibits no edema.  Neurological: He is alert and oriented to person, place, and time. No  cranial nerve deficit. Coordination normal.  Skin: Skin is warm and dry. He is not diaphoretic. No pallor.  Psychiatric: He has a normal mood and affect.  Nursing note and vitals reviewed.   Results for orders placed or performed in visit on 03/13/18 (from the past 72 hour(s))  CBC With Differential     Status: Abnormal   Collection Time: 03/13/18 12:00 AM  Result Value Ref Range   WBC 10.9  (H) 3.4 - 10.8 x10E3/uL   RBC 4.33 4.14 - 5.80 x10E6/uL   Hemoglobin 12.8 (L) 13.0 - 17.7 g/dL   Hematocrit 16.140.3 09.637.5 - 51.0 %   MCV 93 79 - 97 fL   MCH 29.6 26.6 - 33.0 pg   MCHC 31.8 31.5 - 35.7 g/dL   RDW 04.513.1 40.912.3 - 81.115.4 %   Neutrophils 68 Not Estab. %   Lymphs 26 Not Estab. %   Monocytes 5 Not Estab. %   Eos 1 Not Estab. %   Basos 0 Not Estab. %   Neutrophils Absolute 7.3 (H) 1.4 - 7.0 x10E3/uL   Lymphocytes Absolute 2.9 0.7 - 3.1 x10E3/uL   Monocytes Absolute 0.6 0.1 - 0.9 x10E3/uL   EOS (ABSOLUTE) 0.1 0.0 - 0.4 x10E3/uL   Basophils Absolute 0.0 0.0 - 0.2 x10E3/uL   Immature Granulocytes 0 Not Estab. %   Immature Grans (Abs) 0.0 0.0 - 0.1 x10E3/uL  Insulin, random     Status: None   Collection Time: 03/13/18 12:00 AM  Result Value Ref Range   INSULIN 14.4 2.6 - 24.9 uIU/mL  VITAMIN D 25 Hydroxy (Vit-D Deficiency, Fractures)     Status: Abnormal   Collection Time: 03/13/18 12:00 AM  Result Value Ref Range   Vit D, 25-Hydroxy 8.9 (L) 30.0 - 100.0 ng/mL    Comment: Vitamin D deficiency has been defined by the Institute of Medicine and an Endocrine Society practice guideline as a level of serum 25-OH vitamin D less than 20 ng/mL (1,2). The Endocrine Society went on to further define vitamin D insufficiency as a level between 21 and 29 ng/mL (2). 1. IOM (Institute of Medicine). 2010. Dietary reference    intakes for calcium and D. [place]: The    [employer]. 2. Holick MF, Binkley [place], Bischoff-Ferrari HA, et al.    Evaluation, treatment, and prevention of vitamin D    deficiency: an Endocrine Society clinical practice    guideline. JCEM. 2011 Jul; 96(7):1911-30.   Vitamin B12     Status: None   Collection Time: 03/13/18 12:00 AM  Result Value Ref Range   Vitamin B-12 276 232 - 1,245 pg/mL  Folate     Status: None   Collection Time: 03/13/18 12:00 AM  Result Value Ref Range   Folate 3.7 >3.0 ng/mL    Comment: A serum folate concentration of less  than 3.1 ng/mL is considered to represent clinical deficiency.   T3     Status: None   Collection Time: 03/13/18 12:00 AM  Result Value Ref Range   T3, Total 145 71 - 180 ng/dL  T4, free     Status: None   Collection Time: 03/13/18 12:00 AM  Result Value Ref Range   Free T4 1.35 0.82 - 1.77 ng/dL  TSH     Status: None   Collection Time: 03/13/18 12:00 AM  Result Value Ref Range   TSH 3.010 0.450 - 4.500 uIU/mL   Lab Results  Component Value Date   HGBA1C 4.8 01/31/2018   Lab  Results  Component Value Date   CHOL 141 01/31/2018   HDL 38 (L) 01/31/2018   LDLCALC 81 01/31/2018   TRIG 109 01/31/2018   CHOLHDL 3.7 01/31/2018     No results found.  ASSESSMENT AND PLAN:  Nandan was seen today for follow-up.  Diagnoses and all orders for this visit:  Hypertension, secondary: He is doing well.  Encouraged weight loss.  RTC 6 months for recheck HTN, Vitamin D.   -     Renal Function Panel -     chlorthalidone (HYGROTON) 25 MG tablet; Take 0.5 tablets (12.5 mg total) by mouth daily.  Hypovitaminosis D -     Vitamin D, Ergocalciferol, (DRISDOL) 50000 units CAPS capsule; Take 1 capsule (50,000 Units total) by mouth every 7 (seven) days.  Morbid obesity (HCC)  Low serum HDL    The patient is advised to call or return to clinic if he does not see an improvement in symptoms, or to seek the care of the closest emergency department if he worsens with the above plan.   Deliah Boston, MHS, PA-C Primary Care at [facility] 03/14/2018 8:30 AM

## 2018-03-14 NOTE — Patient Instructions (Addendum)
  Keep up the good work.    IF you received an x-ray today, you will receive an invoice from [facility]. Please contact [facility] at [phone] with questions or concerns regarding your invoice.   IF you received labwork today, you will receive an invoice from [facility]. Please contact [facility] at [phone] with questions or concerns regarding your invoice.   Our billing staff will not be able to assist you with questions regarding bills from these companies.  You will be contacted with the lab results as soon as they are available. The fastest way to get your results is to activate your My Chart account. Instructions are located on the last page of this paperwork. If you have not heard from us regarding the results in 2 weeks, please contact this office.      

## 2018-03-15 LAB — RENAL FUNCTION PANEL
ALBUMIN: 4.5 g/dL (ref 3.5–5.5)
BUN/Creatinine Ratio: 12 (ref 9–20)
BUN: 13 mg/dL (ref 6–20)
CHLORIDE: 102 mmol/L (ref 96–106)
CO2: 24 mmol/L (ref 20–29)
Calcium: 9.7 mg/dL (ref 8.7–10.2)
Creatinine, Ser: 1.08 mg/dL (ref 0.76–1.27)
GFR calc non Af Amer: 91 mL/min/{1.73_m2} (ref >59)
GFR, EST AFRICAN AMERICAN: 105 mL/min/{1.73_m2} (ref >59)
GLUCOSE: 78 mg/dL (ref 65–99)
POTASSIUM: 4.6 mmol/L (ref 3.5–5.2)
Phosphorus: 3.4 mg/dL (ref 2.5–4.5)
SODIUM: 141 mmol/L (ref 134–144)

## 2018-03-20 ENCOUNTER — Encounter (INDEPENDENT_AMBULATORY_CARE_PROVIDER_SITE_OTHER): Payer: Self-pay | Admitting: Family Medicine

## 2018-03-27 ENCOUNTER — Encounter (INDEPENDENT_AMBULATORY_CARE_PROVIDER_SITE_OTHER): Payer: Self-pay

## 2018-03-27 ENCOUNTER — Ambulatory Visit (INDEPENDENT_AMBULATORY_CARE_PROVIDER_SITE_OTHER): Payer: No Typology Code available for payment source | Admitting: Family Medicine

## 2018-04-09 ENCOUNTER — Encounter: Payer: Self-pay | Admitting: Neurology

## 2018-04-10 ENCOUNTER — Ambulatory Visit (INDEPENDENT_AMBULATORY_CARE_PROVIDER_SITE_OTHER): Payer: No Typology Code available for payment source | Admitting: Neurology

## 2018-04-10 ENCOUNTER — Encounter: Payer: Self-pay | Admitting: Neurology

## 2018-04-10 VITALS — BP 148/97 | HR 95 | Ht 68.0 in | Wt 376.0 lb

## 2018-04-10 DIAGNOSIS — G4731 Primary central sleep apnea: Secondary | ICD-10-CM

## 2018-04-10 DIAGNOSIS — E669 Obesity, unspecified: Secondary | ICD-10-CM | POA: Diagnosis not present

## 2018-04-10 DIAGNOSIS — G4726 Circadian rhythm sleep disorder, shift work type: Secondary | ICD-10-CM | POA: Insufficient documentation

## 2018-04-10 DIAGNOSIS — E662 Morbid (severe) obesity with alveolar hypoventilation: Secondary | ICD-10-CM

## 2018-04-10 DIAGNOSIS — R51 Headache: Secondary | ICD-10-CM | POA: Diagnosis not present

## 2018-04-10 DIAGNOSIS — G4734 Idiopathic sleep related nonobstructive alveolar hypoventilation: Secondary | ICD-10-CM | POA: Diagnosis not present

## 2018-04-10 DIAGNOSIS — R519 Headache, unspecified: Secondary | ICD-10-CM | POA: Insufficient documentation

## 2018-04-10 DIAGNOSIS — G4730 Sleep apnea, unspecified: Secondary | ICD-10-CM | POA: Insufficient documentation

## 2018-04-10 DIAGNOSIS — G4733 Obstructive sleep apnea (adult) (pediatric): Secondary | ICD-10-CM | POA: Diagnosis not present
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
## 2018-04-10 NOTE — Patient Instructions (Signed)
Obesity Hypoventilation Syndrome Obesity hypoventilation syndrome (OHS) means that you are not breathing well enough to get air in and out of your lungs efficiently (ventilation). This causes a low oxygen level and a high carbon dioxide level in your blood (hypoventilation). Having too much total body fat (obesity) is a significant risk factor for developing OHS. OHS makes it harder for your heart to pump oxygen-rich blood to your body. It can cause sleep disturbances and make you feel sleepy during the day. Over time, OHS can increase your risk for:  Heart disease.  High blood pressure (hypertension).  Reduced ability to absorb sugar from the bloodstream (insulin resistance).  Heart failure. Over time, OHS weakens your heart and can lead to heart failure.  What are the causes? The exact cause of OHS is not known. Possible causes include:  Pressure on the lungs from excess body weight.  Obesity-related changes in how much air the lungs can hold (lung capacity) and how much they can expand (lung compliance).  Failure of the brain to regulate oxygen and carbon dioxide levels properly.  Chemicals (hormones) produced by excess fat cells interfering with breathing regulation.  A breathing condition in which breathing pauses or becomes shallow during sleep (sleep apnea). This condition can eventually cause the body to ventilate poorly and to hold onto carbon dioxide during the day.  What increases the risk? You may have a greater risk for OHS if you:  Have a BMI of 30 or higher. BMI is an estimate of body fat that is calculated from height and weight. For adults, a BMI of 30 or higher is considered obese.  Are 40?32 years old.  Carry most of your excess weight around your waist.  Experience moderate symptoms of sleep apnea.  What are the signs or symptoms? The most common symptoms of OHS are:  Daytime sleepiness.  Lack of energy.  Shortness of breath.  Morning  headaches.  Sleep apnea.  Trouble concentrating.  Irritability, mood swings, or depression.  Swollen veins in the neck.  Swelling of the legs.  How is this diagnosed? Your health care provider may suspect OHS if you are obese and have poor breathing during the day and at night. Your health care provider will also do a physical exam. You may have tests to:  Measure your BMI.  Measure your blood oxygen level with a sensor placed on your finger (pulse oximetry).  Measure blood oxygen and carbon dioxide in a blood sample.  Measure the amount of red blood cells in a blood sample. OHS causes the number of red blood cells you have to increase (polycythemia).  Check your breathing ability (pulmonary function testing).  Check your breathing ability, breathing patterns, and oxygen level while you sleep (sleep study).  You may also have a chest X-ray to rule out other breathing problems. You may have an electrocardiogram (ECG) and or echocardiogram to check for signs of heart failure. How is this treated? Weight loss is the most important part of treatment for OHS, and it may be the only treatment that you need. Other treatments may include:  Using a device to open your airway while you sleep, such as a continuous positive airway pressure (CPAP) machine that delivers oxygen to your airway through a mask.  Surgery (gastric bypass surgery) to lower your BMI. This may be needed if: ? You are very obese. ? Other treatments have not worked for you. ? Your OHS is very severe and is causing organ damage, such as   heart failure.  Follow these instructions at home: Medicines  Take over-the-counter and prescription medicines only as told by your health care provider.  Ask your health care provider what medicines are safe for you. You may be told to avoid medicines that can impair breathing and make OHS worse, such as sedatives and narcotics. Sleeping habits  If you are prescribed a CPAP  machine, make sure you understand and use the machine as directed.  Try to get 8 hours of sleep every night.  Go to bed at the same time every night, and get up at the same time every day. General instructions  Work with your health care provider to make a diet and exercise plan that helps you reach and maintain a healthy weight.  Eat a healthy diet.  Avoid smoking.  Exercise regularly as told by your health care provider.  During the evening, do not drink caffeine and do not eat heavy meals.  Keep all follow-up visits as told by your health care provider. This is important. Contact a health care provider if:   You experience new or worsening shortness of breath.  You have chest pain.  You have an irregular heartbeat (palpitations).  You have dizziness.  You faint.  You develop a cough.  You have a fever.  You have chest pain when you breathe (pleurisy). This information is not intended to replace advice given to you by your health care provider. Make sure you discuss any questions you have with your health care provider. Document Released: 02/22/2016 Document Revised: 04/01/2016 Document Reviewed: 02/22/2016 Elsevier Interactive Patient Education  2018 Elsevier Inc.  

## 2018-04-10 NOTE — Addendum Note (Signed)
Addended by: Melvyn NovasHMEIER, Ovila Lepage on: 04/10/2018 10:00 AM   Modules accepted: Orders

## 2018-04-19 ENCOUNTER — Ambulatory Visit (INDEPENDENT_AMBULATORY_CARE_PROVIDER_SITE_OTHER): Payer: No Typology Code available for payment source | Admitting: Neurology

## 2018-04-19 DIAGNOSIS — G4734 Idiopathic sleep related nonobstructive alveolar hypoventilation: Secondary | ICD-10-CM

## 2018-04-19 DIAGNOSIS — E669 Obesity, unspecified: Secondary | ICD-10-CM

## 2018-04-19 DIAGNOSIS — G4726 Circadian rhythm sleep disorder, shift work type: Secondary | ICD-10-CM

## 2018-04-19 DIAGNOSIS — R51 Headache: Secondary | ICD-10-CM

## 2018-04-19 DIAGNOSIS — G4731 Primary central sleep apnea: Secondary | ICD-10-CM

## 2018-04-19 DIAGNOSIS — G4730 Sleep apnea, unspecified: Secondary | ICD-10-CM

## 2018-04-19 DIAGNOSIS — E662 Morbid (severe) obesity with alveolar hypoventilation: Secondary | ICD-10-CM

## 2018-04-19 DIAGNOSIS — G4733 Obstructive sleep apnea (adult) (pediatric): Secondary | ICD-10-CM

## 2018-04-19 DIAGNOSIS — R519 Headache, unspecified: Secondary | ICD-10-CM

## 2018-04-25 ENCOUNTER — Telehealth: Payer: Self-pay | Admitting: Physician Assistant
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
## 2018-04-26 NOTE — Addendum Note (Signed)
Addended by: Melvyn NovasHMEIER, Mauria Asquith on: 04/26/2018 06:34 PM   Modules accepted: Orders

## 2018-05-01 ENCOUNTER — Encounter: Payer: Self-pay | Admitting: Neurology

## 2018-07-19 ENCOUNTER — Ambulatory Visit: Payer: Self-pay | Admitting: Neurology

## 2018-07-19 ENCOUNTER — Telehealth: Payer: Self-pay | Admitting: *Deleted

## 2018-07-19 NOTE — Telephone Encounter (Signed)
Patient no-showed today's appointment.

## 2018-07-19 NOTE — Telephone Encounter (Signed)
DONE

## 2018-07-20 ENCOUNTER — Encounter: Payer: Self-pay | Admitting: Neurology

## 2018-09-14 ENCOUNTER — Ambulatory Visit: Payer: No Typology Code available for payment source | Admitting: Physician Assistant

## 2019-06-26 ENCOUNTER — Other Ambulatory Visit: Payer: Self-pay

## 2019-06-26 DIAGNOSIS — Z20822 Contact with and (suspected) exposure to covid-19: Secondary | ICD-10-CM

## 2019-06-28 LAB — NOVEL CORONAVIRUS, NAA: SARS-CoV-2, NAA: NOT DETECTED

## 2021-01-21 DIAGNOSIS — F4320 Adjustment disorder, unspecified: Secondary | ICD-10-CM | POA: Diagnosis not present

## 2022-04-30 ENCOUNTER — Encounter (HOSPITAL_COMMUNITY): Payer: Self-pay | Admitting: Emergency Medicine

## 2022-04-30 ENCOUNTER — Emergency Department (HOSPITAL_COMMUNITY): Payer: BLUE CROSS/BLUE SHIELD

## 2022-04-30 ENCOUNTER — Emergency Department (HOSPITAL_COMMUNITY)
Admission: EM | Admit: 2022-04-30 | Discharge: 2022-04-30 | Disposition: A | Discharge status: Home / Self Care | Payer: BLUE CROSS/BLUE SHIELD | Attending: Emergency Medicine | Admitting: Emergency Medicine

## 2022-04-30 DIAGNOSIS — D72829 Elevated white blood cell count, unspecified: Secondary | ICD-10-CM | POA: Diagnosis not present

## 2022-04-30 DIAGNOSIS — R7989 Other specified abnormal findings of blood chemistry: Secondary | ICD-10-CM | POA: Diagnosis not present

## 2022-04-30 DIAGNOSIS — Z79899 Other long term (current) drug therapy: Secondary | ICD-10-CM | POA: Insufficient documentation

## 2022-04-30 DIAGNOSIS — R918 Other nonspecific abnormal finding of lung field: Secondary | ICD-10-CM | POA: Diagnosis not present

## 2022-04-30 DIAGNOSIS — R0602 Shortness of breath: Secondary | ICD-10-CM | POA: Diagnosis not present

## 2022-04-30 DIAGNOSIS — R079 Chest pain, unspecified: Secondary | ICD-10-CM | POA: Diagnosis not present

## 2022-04-30 DIAGNOSIS — I2699 Other pulmonary embolism without acute cor pulmonale: Secondary | ICD-10-CM | POA: Diagnosis not present

## 2022-04-30 DIAGNOSIS — J189 Pneumonia, unspecified organism: Secondary | ICD-10-CM | POA: Diagnosis not present

## 2022-04-30 DIAGNOSIS — I10 Essential (primary) hypertension: Secondary | ICD-10-CM | POA: Diagnosis not present

## 2022-04-30 LAB — CBC
HCT: 42.3 % (ref 39.0–52.0)
Hemoglobin: 13.6 g/dL (ref 13.0–17.0)
MCH: 30.2 pg (ref 26.0–34.0)
MCHC: 32.2 g/dL (ref 30.0–36.0)
MCV: 93.8 fL (ref 80.0–100.0)
Platelets: 266 10*3/uL (ref 150–400)
RBC: 4.51 MIL/uL (ref 4.22–5.81)
RDW: 12.1 % (ref 11.5–15.5)
WBC: 10.6 10*3/uL — ABNORMAL HIGH (ref 4.0–10.5)
nRBC: 0 % (ref 0.0–0.2)

## 2022-04-30 LAB — TROPONIN I (HIGH SENSITIVITY): Troponin I (High Sensitivity): 16 ng/L (ref <18)

## 2022-04-30 LAB — BASIC METABOLIC PANEL
Anion gap: 6 (ref 5–15)
BUN: 16 mg/dL (ref 6–20)
CO2: 28 mmol/L (ref 22–32)
Calcium: 9.7 mg/dL (ref 8.9–10.3)
Chloride: 107 mmol/L (ref 98–111)
Creatinine, Ser: 1.08 mg/dL (ref 0.61–1.24)
GFR, Estimated: >60 mL/min (ref >60)
Glucose, Bld: 88 mg/dL (ref 70–99)
Potassium: 3.6 mmol/L (ref 3.5–5.1)
Sodium: 141 mmol/L (ref 135–145)

## 2022-04-30 LAB — BRAIN NATRIURETIC PEPTIDE: B Natriuretic Peptide: 131.5 pg/mL — ABNORMAL HIGH (ref 0.0–100.0)

## 2022-04-30 MED ORDER — POTASSIUM CHLORIDE CRYS ER 20 MEQ PO TBCR
40.0000 meq | EXTENDED_RELEASE_TABLET | Freq: Once | ORAL | Status: AC
  Administered 2022-04-30: 40 meq via ORAL
  Filled 2022-04-30: qty 2

## 2022-04-30 MED ORDER — HYDRALAZINE HCL 20 MG/ML IJ SOLN
10.0000 mg | Freq: Once | INTRAMUSCULAR | Status: AC
  Administered 2022-04-30: 10 mg via INTRAVENOUS
  Filled 2022-04-30: qty 1

## 2022-04-30 MED ORDER — ALBUTEROL SULFATE HFA 108 (90 BASE) MCG/ACT IN AERS: 2.0000 | INHALATION_SPRAY | RESPIRATORY_TRACT | Status: DC | PRN

## 2022-04-30 MED ORDER — IOHEXOL 350 MG/ML SOLN
100.0000 mL | Freq: Once | INTRAVENOUS | Status: AC | PRN
  Administered 2022-04-30: 100 mL via INTRAVENOUS

## 2022-04-30 MED ORDER — FUROSEMIDE 20 MG PO TABS: 20.0000 mg | ORAL_TABLET | Freq: Every day | ORAL | 0 refills | Status: DC

## 2022-04-30 MED ORDER — LISINOPRIL 10 MG PO TABS: 20.0000 mg | ORAL_TABLET | Freq: Every day | ORAL | 1 refills | Status: DC

## 2022-04-30 MED ORDER — AMOXICILLIN 500 MG PO CAPS: 1000.0000 mg | ORAL_CAPSULE | Freq: Three times a day (TID) | ORAL | 0 refills | Status: AC

## 2022-04-30 MED ORDER — AZITHROMYCIN 250 MG PO TABS: ORAL_TABLET | ORAL | 0 refills | Status: AC

## 2022-04-30 MED ORDER — FUROSEMIDE 20 MG PO TABS
30.0000 mg | ORAL_TABLET | Freq: Once | ORAL | Status: DC
  Filled 2022-04-30: qty 1.5

## 2022-04-30 MED ORDER — FUROSEMIDE 10 MG/ML IJ SOLN
30.0000 mg | Freq: Once | INTRAMUSCULAR | Status: AC
  Administered 2022-04-30: 30 mg via INTRAVENOUS
  Filled 2022-04-30: qty 4

## 2022-04-30 NOTE — Discharge Instructions (Addendum)
Please return to the ED with any new concerns or worsening signs or symptoms.  If you begin experiencing increased shortness of breath, leg swelling please return to the ED for Please follow-up with the cardiology team I referred you to.  You will receive a call from them.  In the event that you do not, please use the information on this chart to contact them. Please begin taking antibiotics I prescribed you.  You will take these for the next 5 days. Please begin taking home blood pressure medication.  You will take this once a day.  I given you 1 refill. Please only take Lasix (the medication that makes you pee) on days that you are feeling short of breath or having lower extremity swelling Please begin taking her blood pressure at home.  I have attached blood pressure sheets so that you can measure it.  Please also read the attached guide concerning community-acquired pneumonia

## 2022-04-30 NOTE — ED Notes (Signed)
Maintained O2 sats at 93% while ambulating.

## 2022-04-30 NOTE — ED Triage Notes (Signed)
Pt endorses SOB that started this morning. Pt noticed it when he was taking the trash out. States the SOB is worse with exertion. Denies CP or leg swelling.

## 2022-04-30 NOTE — ED Notes (Signed)
Patient was walked around the ED   o2 sats 93-94   once back in his room he was SOB from our short walk

## 2022-04-30 NOTE — ED Provider Notes (Signed)
[facility]-EMERGENCY DEPT Provider Note   CSN: [id number] Arrival date & time: 04/30/22  4098     History  Chief Complaint  Patient presents with   Shortness of Breath    Jacob Lloyd is a 36 y.o. male with history of hypertension, sleep apnea.  The patient presents to ED for evaluation of shortness of breath.  Patient reports that he works third shift overnight and this morning he got off of work, went home and was laying across his bed.  The patient states that at this time he noticed that it was harder for him to take a deep breath then but thought nothing of it.  The patient states that he then went outside to take his trash can to the street and was unable to walk from his house down to the curb without needing to stop to take a breath.  The patient reports this is very unusual for him.  The patient states he does smoke marijuana however denies smoking tobacco or vaping.  Patient denies any exogenous hormone use, unilateral leg swelling, hemoptysis.  Patient reports he did recently travel from [place] to [place] 1 week ago.  Patient denies history of blood clots.  Patient reports history of CHF and grandfather, CAD in mother.  Patient denies history of asthma.  Patient endorsing shortness of breath, chest tightness.  Patient denies sore throat, cough, fever, nausea or vomiting, diarrhea, sick contacts.   Shortness of Breath Associated symptoms: no chest pain, no cough, no fever, no sore throat and no vomiting        Home Medications Prior to Admission medications   Medication Sig Start Date End Date Taking? Authorizing Provider  amoxicillin (AMOXIL) 500 MG capsule Take 2 capsules (1,000 mg total) by mouth 3 (three) times daily for 5 days. 04/30/22 05/05/22 Yes Al Decant, PA-C  azithromycin (ZITHROMAX Z-PAK) 250 MG tablet Take 2 tablets (500 mg total) by mouth daily for 1 day, THEN 1 tablet (250 mg total) daily for 4 days. 04/30/22 05/05/22  Yes Al Decant, PA-C  furosemide (LASIX) 20 MG tablet Take 1 tablet (20 mg total) by mouth daily. 04/30/22  Yes Al Decant, PA-C  lisinopril (ZESTRIL) 10 MG tablet Take 2 tablets (20 mg total) by mouth daily. 04/30/22  Yes Al Decant, PA-C  chlorthalidone (HYGROTON) 25 MG tablet Take 0.5 tablets (12.5 mg total) by mouth daily. 03/14/18   Ofilia Neas, PA-C  Vitamin D, Ergocalciferol, (DRISDOL) 50000 units CAPS capsule Take 1 capsule (50,000 Units total) by mouth every 7 (seven) days. 03/14/18   Ofilia Neas, PA-C      Allergies    Patient has no known allergies.    Review of Systems   Review of Systems  Constitutional:  Negative for chills and fever.  HENT:  Negative for sore throat.   Respiratory:  Positive for chest tightness and shortness of breath. Negative for cough.   Cardiovascular:  Negative for chest pain and leg swelling.  Gastrointestinal:  Negative for nausea and vomiting.    Physical Exam Updated Vital Signs BP 129/80   Pulse 89   Temp 98.5 F (36.9 C) (Oral)   Resp 14   Ht 5\' 8"  (1.727 m)   Wt (!) 167.8 kg   SpO2 94%   BMI 56.26 kg/m  Physical Exam  ED Results / Procedures / Treatments   Labs (all labs ordered are listed, but only abnormal results are displayed) Labs Reviewed  CBC - Abnormal; Notable for the following components:      Result Value   WBC 10.6 (*)    All other components within normal limits  BRAIN NATRIURETIC PEPTIDE - Abnormal; Notable for the following components:   B Natriuretic Peptide 131.5 (*)    All other components within normal limits  BASIC METABOLIC PANEL  TROPONIN I (HIGH SENSITIVITY)    EKG EKG Interpretation  Date/Time:  Saturday April 30 2022 09:38:55 EDT Ventricular Rate:  95 PR Interval:  154 QRS Duration: 197 QT Interval:  372 QTC Calculation: 468 R Axis:   12 Text Interpretation: Sinus rhythm Nonspecific intraventricular conduction delay Borderline repol abnormality, diffuse  leads Baseline wander in lead(s) I [name] aVL aVF V1 Confirmed by Gloris Manchester ([id number]) on 04/30/2022 10:39:16 AM  Radiology CT Angio Chest PE W and/or Wo Contrast  Result Date: 04/30/2022 CLINICAL DATA:  PE suspected EXAM: CT ANGIOGRAPHY CHEST WITH CONTRAST TECHNIQUE: Multidetector CT imaging of the chest was performed using the standard protocol during bolus administration of intravenous contrast. Multiplanar CT image reconstructions and MIPs were obtained to evaluate the vascular anatomy. RADIATION DOSE REDUCTION: This exam was performed according to the departmental dose-optimization program which includes automated exposure control, adjustment of the mA and/or kV according to patient size and/or use of iterative reconstruction technique. CONTRAST:  OMNIPAQUE IOHEXOL 350 MG/ML SOLN COMPARISON:  None Available. FINDINGS: Cardiovascular: Examination for pulmonary embolism is limited by marginal contrast bolus, body habitus, and breath motion artifact. Within this limitation, no evidence of pulmonary embolism through the proximal segmental pulmonary arterial level. Cardiomegaly. No pericardial effusion. Mediastinum/Nodes: No enlarged mediastinal, hilar, or axillary lymph nodes. Thyroid gland, trachea, and esophagus demonstrate no significant findings. Lungs/Pleura: Heterogeneous and ground-glass airspace opacity throughout the left lung (series 11, image 93) no pleural effusion or pneumothorax. Upper Abdomen: No acute abnormality.  Hepatic steatosis. Musculoskeletal: No chest wall abnormality. No acute osseous findings. Review of the MIP images confirms the above findings. IMPRESSION: 1. Examination for pulmonary embolism is limited by marginal contrast bolus, body habitus, and breath motion artifact. Within this limitation, no evidence of pulmonary embolism through the proximal segmental pulmonary arterial level. 2. Heterogeneous and ground-glass airspace opacity throughout the left lung, consistent with  infection or aspiration. 3. Cardiomegaly. 4. Hepatic steatosis. Electronically Signed   By: Jearld Lesch M.D.   On: 04/30/2022 13:07   DG Chest 2 View  Result Date: 04/30/2022 CLINICAL DATA:  Shortness of breath.  Patient denies pain. EXAM: CHEST - 2 VIEW COMPARISON:  None Available. FINDINGS: No pneumothorax. The cardiac silhouette is enlarged. No pulmonary nodules or masses. No overt edema. Haziness over the lateral left chest is likely due to overlapping soft tissue and patient rotation. No focal infiltrate. The study is somewhat limited due to patient body habitus. IMPRESSION: The cardiac silhouette is enlarged. No acute abnormalities are identified. Electronically Signed   By: Gerome Sam [name] M.D.   On: 04/30/2022 11:01    Procedures Procedures   Medications Ordered in ED Medications  hydrALAZINE (APRESOLINE) injection 10 mg (10 mg Intravenous Given 04/30/22 1016)  potassium chloride SA (KLOR-CON M) CR tablet 40 mEq (40 mEq Oral Given 04/30/22 1210)  hydrALAZINE (APRESOLINE) injection 10 mg (10 mg Intravenous Given 04/30/22 1250)  furosemide (LASIX) injection 30 mg (30 mg Intravenous Given 04/30/22 1209)  iohexol (OMNIPAQUE) 350 MG/ML injection 100 mL (100 mLs Intravenous Contrast Given 04/30/22 1253)    ED Course/ Medical Decision Making/ A&P  Medical Decision Making Amount and/or Complexity of Data Reviewed Labs: ordered. Radiology: ordered.  Risk Prescription drug management.   36 year old male presents to the ED for evaluation.  Please see HPI for further details.  On examination, the patient is afebrile and tachycardic to 107.  Patient lung sounds are slightly rhonchorous bilaterally, the patient oxygen saturation is 93% on room air.  Patient abdomen soft and compressible all 4 quadrants.  Patient posterior oropharynx is not erythematous, uvula midline.  There is no exudate.  Patient denies orthopnea.  Patient worked up utilizing the following labs and  imaging studies interpreted by me personally: - Troponin 16.  Patient denies any active chest pain - EKG sinus rhythm, nonischemic - BNP elevated to 131.5, no baseline to compare to - CBC elevated white blood cell count of 10.6 over the patient's afebrile - BMP unremarkable - CT angio shows groundglass opacity concerning for infection versus aspiration.  The patient denies any event that would be concerning for aspiration.  Most likely community-acquired pneumonia.  Patient will be started on amoxicillin as well as azithromycin - Plain film imaging of patient chest shows enlarged cardiac silhouette concerning for cardiomegaly/CHF  Patient hypertensive on arrival complaining of shortness of breath.  Patient was given 10 mg hydralazine with decreased blood pressure.  Patient blood pressure maintained around 190 systolic so second dose of 10 mg hydralazine was administered and the patient's blood pressure stabilized at this time.  Patient given 40 mEq potassium chloride optimize potassium levels.  Patient given 30 mg Lasix for diuresis.  Patient ambulated with pulse oximeter, maintained oxygen saturation at 95%.  Patient will be discharged home on CAP treatment to include amoxicillin and azithromycin.  The patient will be referred to cardiology for further management of suspected early onset CHF and will be sent home with 20 mg Lasix and told to take this when he is feeling short of breath or having lower extremity swelling.  I will also refill the patient's home blood pressure medication. Patient was given return precautions and he voiced understanding.  The patient had all of his questions answered to his satisfaction prior to discharge.  Patient stable at this time for discharge home  Final Clinical Impression(s) / ED Diagnoses Final diagnoses:  Benign essential HTN  Community acquired pneumonia, unspecified laterality    Rx / DC Orders ED Discharge Orders          Ordered    furosemide  (LASIX) 20 MG tablet  Daily        04/30/22 1456    amoxicillin (AMOXIL) 500 MG capsule  3 times daily        04/30/22 1456    azithromycin (ZITHROMAX Z-PAK) 250 MG tablet  Daily        04/30/22 1456    Ambulatory referral to Cardiology       Comments: Concern for new onset CHF. Elevated BNP and has famhx of CHF. Patient given lasix, diuresed, states his SOB ceased.   04/30/22 1458    lisinopril (ZESTRIL) 10 MG tablet  Daily        04/30/22 1501              Clent Ridges 04/30/22 1503    Gloris Manchester, MD 05/02/22 [phone]

## 2022-05-17 DIAGNOSIS — I10 Essential (primary) hypertension: Secondary | ICD-10-CM | POA: Diagnosis not present

## 2022-05-17 DIAGNOSIS — L0390 Cellulitis, unspecified: Secondary | ICD-10-CM | POA: Diagnosis not present

## 2022-05-17 DIAGNOSIS — I5022 Chronic systolic (congestive) heart failure: Secondary | ICD-10-CM | POA: Diagnosis not present

## 2022-06-28 NOTE — Progress Notes (Unsigned)
Advanced Hypertension Clinic Initial Assessment:    Date:  06/30/2022   ID:  Jacob Lloyd, DOB 05/25/1986, MRN [id number]  PCP:  Patient, No Pcp Per  Cardiologist:  None  Nephrologist:  Referring MD: Terrill Mohr*   CC: Hypertension  History of Present Illness:    Jacob Lloyd is a 36 y.o. male with a hx of HTN, sleep apnea here to establish care in the Advanced Hypertension Clinic.   Previously seen by cardiology in [place], [place] in 2015. Does not recall details of prior workup.   ED visit 04/30/22 with hypertension and shortness of breath workup revealing enlarged cardiac sihouette, elevated BNP 131.5, ground glass opacities on CT. Treated empirically for pneumonia, given IV Lasix, referred to cardiology.   Saw [facility] 05/17/22 treated for cellulitis with PO abx with BP noted to be 190/130 in setting of not taking his medications for a few days.   Karin Griffith was diagnosed with hypertension at least 10 years ago. It has been difficult to control. Blood pressure not checked routinely at home. He continues to have "error" messages on the machine. he reports tobacco use never. Smokes marijuana daily.  Alcohol use socially. 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 he eats at home and outside of the home and does not follow low sodium diet. Has been trying to reduce salt. He wears his BIPAP regularly. Reports no shortness of breath nor dyspnea on exertion aside from recent ED visit. Reports no chest pain, pressure, or tightness. No edema, orthopnea, PND. Reports no palpitations.    05/17/22 labs: Creatinine 1.14, GFR 86, K 3.9, alkaline phosphatase 60, AST 20, ALT 19 Wbc 8.6, HB 13.1, Plt 265 TSH 3.86, BNP 101  Previous antihypertensives: Lisinopril-HCTZ - transitioned to Lisinopril and Chlorthalidone  Past Medical History:  Diagnosis Date   Hypertension    Sleep apnea     Past Surgical  History:  Procedure Laterality Date   EYE SURGERY     FRACTURE SURGERY Left    ankle surgery   TONSILLECTOMY  2005    Current Medications: Current Meds  Medication Sig   amLODipine-olmesartan (AZOR) 5-40 MG tablet Take 1 tablet by mouth daily.   [DISCONTINUED] furosemide (LASIX) 20 MG tablet Take 1 tablet (20 mg total) by mouth daily.   [DISCONTINUED] lisinopril (ZESTRIL) 10 MG tablet Take 2 tablets (20 mg total) by mouth daily.     Allergies:   Patient has no known allergies.   Social History   Socioeconomic History   Marital status: Single    Spouse name: Not on file   Number of children: 1   Years of education: Not on file   Highest education level: Not on file  Occupational History   Occupation: Shipping and Receiving   Tobacco Use   Smoking status: Former    Types: Cigarettes    Quit date: 2016    Years since quitting: 7.7   Smokeless tobacco: Never  Vaping Use   Vaping Use: Never used  Substance and Sexual Activity   Alcohol use: Yes    Comment: WEEKENDS   Drug use: No   Sexual activity: Yes    Birth control/protection: None  Other Topics Concern   Not on file  Social History Narrative   Not on file   Social Determinants of Health   Financial Resource Strain: Low Risk  (06/30/2022)   Overall Emergency planning/management officer Strain (  CARDIA)    Difficulty of Paying Living Expenses: Not very hard  Food Insecurity: No Food Insecurity (06/30/2022)   Hunger Vital Sign    Worried About Running Out of Food in the Last Year: Never true    Ran Out of Food in the Last Year: Never true  Transportation Needs: No Transportation Needs (06/30/2022)   PRAPARE - Administrator, Civil Service (Medical): No    Lack of Transportation (Non-Medical): No  Physical Activity: Sufficiently Active (06/30/2022)   Exercise Vital Sign    Days of Exercise per Week: 7 days    Minutes of Exercise per Session: 90 min  Stress: No Stress Concern Present (06/30/2022)   [employer] of  Occupational Health - Occupational Stress Questionnaire    Feeling of Stress : Not at all  Social Connections: Moderately Integrated (06/30/2022)   Social Connection and Isolation Panel [NHANES]    Frequency of Communication with Friends and Family: More than three times a week    Frequency of Social Gatherings with Friends and Family: Twice a week    Attends Religious Services: 1 to 4 times per year    Active Member of [employer] or Organizations: No    Attends Engineer, structural: Never    Marital Status: Living with partner     Family History: The patient's family history includes Cancer in his maternal grandmother; Depression in his mother; Heart disease in his maternal grandfather and maternal grandmother; Hyperlipidemia in his maternal grandfather and maternal grandmother; Hypertension in his maternal grandfather, maternal grandmother, and mother.  ROS:   Please see the history of present illness.     All other systems reviewed and are negative.  EKGs/Labs/Other Studies Reviewed:    EKG: No EKG today.  Recent Labs: 04/30/2022: B Natriuretic Peptide 131.5; BUN 16; Creatinine, Ser 1.08; Hemoglobin 13.6; Platelets 266; Potassium 3.6; Sodium 141   Recent Lipid Panel    Component Value Date/Time   CHOL 141 01/31/2018 1047   TRIG 109 01/31/2018 1047   HDL 38 (L) 01/31/2018 1047   CHOLHDL 3.7 01/31/2018 1047   LDLCALC 81 01/31/2018 1047    Physical Exam:   VS:  BP (!) 159/111 Comment: right arm  Pulse 91   Ht 5\' 8"  (1.727 m)   Wt (!) 374 lb (169.6 kg)   BMI 56.87 kg/m  , BMI Body mass index is 56.87 kg/m. GENERAL:  Well appearing, overweight HEENT: Pupils equal round and reactive, fundi not visualized, oral mucosa unremarkable NECK:  No jugular venous distention, waveform within normal limits, carotid upstroke brisk and symmetric, no bruits, no thyromegaly LYMPHATICS:  No cervical adenopathy LUNGS:  Clear to auscultation bilaterally HEART:  RRR.  PMI not displaced or  sustained,S1 and S2 within normal limits, no S3, no S4, no clicks, no rubs, no murmurs ABD:  Flat, positive bowel sounds normal in frequency in pitch, no bruits, no rebound, no guarding, no midline pulsatile mass, no hepatomegaly, no splenomegaly EXT:  2 plus pulses throughout, no edema, no cyanosis no clubbing SKIN:  No rashes no nodules, varicose veins NEURO:  Cranial nerves II through XII grossly intact, motor grossly intact throughout PSYCH:  Cognitively intact, oriented to person place and time   ASSESSMENT/PLAN:    HTN -greater than 10-year history of high blood pressure that has not been well controlled.  Compliant with BIPAP. Heart healthy diet and regular cardiovascular exercise encouraged.   Stop lisinopril.  Start amlodipine-olmesartan 5-40 mg daily.  Continue Lasix 20 mg  daily until echocardiogram results received.  Detailed below. CMP, TSH today.  Heart healthy diet and regular cardiovascular exercise encouraged.   Refer to [facility].  Plan for carotid duplex due to asymmetric BP to rule out aortic coarctation.  If BP remains difficult to control despite medication changes consider renal artery duplex at follow-up.  Dyspnea -ED visit 04/30/2022 with dyspnea and mildly elevated BNP.  Echocardiogram to rule out heart failure and assess for hypertensive heart disease.  OSA - Compliant with BIPAP.  Continue compliance encouraged.  Obesity - Weight loss via diet and exercise encouraged. Discussed the impact being overweight would have on cardiovascular risk. No formal exercise routine.  Update A1c,direct LDL today.  Consider GLP-1 at follow-up.  Screening for Secondary Hypertension:     06/30/2022    1:12 PM  Causes  Drugs/Herbals Screened  Sleep Apnea Screened     - Comments on BIPAP  Thyroid Disease Screened     - Comments 06/2022 TSH  Coarctation of the Aorta Screened     - Comments 06/2022 carotid duplex  Compliance Screened    Relevant Labs/Studies:    Latest Ref  Rng & Units 04/30/2022   10:03 AM 03/14/2018    8:48 AM 01/31/2018   10:47 AM  Basic Labs  Sodium 135 - 145 mmol/L 141  141  142   Potassium 3.5 - 5.1 mmol/L 3.6  4.6  4.4   Creatinine 0.61 - 1.24 mg/dL 9.83  3.82  5.05        Latest Ref Rng & Units 03/13/2018   12:00 AM 01/31/2018   10:47 AM  Thyroid   TSH 0.450 - 4.500 uIU/mL 3.010  2.130                     he consents to be monitored in our remote patient monitoring program through [employer].  he will track his blood pressure twice daily and understands that these trends will help [place] to adjust his medications as needed prior to his next appointment.  he is not interested in enrolling in the PREP exercise and nutrition program through the [facility].     Disposition:    FU with MD/PharmD in 2 months    Medication Adjustments/Labs and Tests Ordered: Current medicines are reviewed at length with the patient today.  Concerns regarding medicines are outlined above.  Orders Placed This Encounter  Procedures   HgB A1c   Comprehensive metabolic panel   TSH   Direct LDL   Cantril's Ladder Assessment   ECHOCARDIOGRAM COMPLETE   VAS US CAROTID   Meds ordered this encounter  Medications   amLODipine-olmesartan (AZOR) 5-40 MG tablet    Sig: Take 1 tablet by mouth daily.    Dispense:  30 tablet    Refill:  2    Order Specific Question:   Supervising Provider    Answer:   Jodelle Red [[id number]]   furosemide (LASIX) 20 MG tablet    Sig: Take 1 tablet (20 mg total) by mouth daily.    Dispense:  30 tablet    Refill:  2     Signed, Alver Sorrow, NP  06/30/2022 1:13 PM    [facility]

## 2022-06-30 ENCOUNTER — Encounter (HOSPITAL_BASED_OUTPATIENT_CLINIC_OR_DEPARTMENT_OTHER): Payer: Self-pay | Admitting: Family

## 2022-06-30 ENCOUNTER — Ambulatory Visit (INDEPENDENT_AMBULATORY_CARE_PROVIDER_SITE_OTHER): Payer: BC Managed Care – PPO | Admitting: Family

## 2022-06-30 VITALS — BP 159/111 | HR 91 | Ht 68.0 in | Wt 374.0 lb

## 2022-06-30 DIAGNOSIS — Z006 Encounter for examination for normal comparison and control in clinical research program: Secondary | ICD-10-CM

## 2022-06-30 DIAGNOSIS — R0609 Other forms of dyspnea: Secondary | ICD-10-CM | POA: Diagnosis not present

## 2022-06-30 DIAGNOSIS — G4733 Obstructive sleep apnea (adult) (pediatric): Secondary | ICD-10-CM

## 2022-06-30 DIAGNOSIS — I10 Essential (primary) hypertension: Secondary | ICD-10-CM | POA: Diagnosis not present

## 2022-06-30 MED ORDER — FUROSEMIDE 20 MG PO TABS: 20.0000 mg | ORAL_TABLET | Freq: Every day | ORAL | 2 refills | Status: DC

## 2022-06-30 MED ORDER — AMLODIPINE-OLMESARTAN 5-40 MG PO TABS: 1.0000 | ORAL_TABLET | Freq: Every day | ORAL | 2 refills | Status: DC

## 2022-06-30 NOTE — Research (Signed)
  Subject Name: Jacob Lloyd met inclusion and exclusion criteria for the Virtual Care and Social Determinant Interventions for the management of hypertension trial.  The informed consent form, study requirements and expectations were reviewed with the subject by Dr. Oval Linsey and myself. The subject was given the opportunity to read the consent and ask questions. The subject verbalized understanding of the trial requirements.  All questions were addressed prior to the signing of the consent form. The subject agreed to participate in the trial and signed the informed consent. The informed consent was obtained prior to performance of any protocol-specific procedures for the subject.  A copy of the signed informed consent was given to the subject and a copy was placed in the subject's medical record.  [name] was randomized to Group 1.

## 2022-06-30 NOTE — Patient Instructions (Signed)
Medication Instructions:  Your physician has recommended you make the following change in your medication:   Stop: Lisinopril   Start: Amlodipine- Olmesartan 5/40mg  tablet daily    Labwork: Your physician recommends that you return for lab work today- CMP, Direct LDL, TSH, and A1C    Testing/Procedures: Your physician has requested that you have a carotid duplex. This test is an ultrasound of the carotid arteries in your neck. It looks at blood flow through these arteries that supply the brain with blood. Allow one hour for this exam. There are no restrictions or special instructions.  Your physician has requested that you have an echocardiogram. Echocardiography is a painless test that uses sound waves to create images of your heart. It provides your doctor with information about the size and shape of your heart and how well your heart's chambers and valves are working. This procedure takes approximately one hour. There are no restrictions for this procedure. [address]    Follow-Up: NOVEMBER 9TH AT 9AM WITH PHARMD AT THE [facility] OFFICE IN [facility]     Referrals:  We have referred you to Amy for Stress management. She will reach out to you!    Special Instructions:  DASH Eating Plan DASH stands for Dietary Approaches to Stop Hypertension. The DASH eating plan is a healthy eating plan that has been shown to: Reduce high blood pressure (hypertension). Reduce your risk for type 2 diabetes, heart disease, and stroke. Help with weight loss. What are tips for following this plan? Reading food labels Check food labels for the amount of salt (sodium) per serving. Choose foods with less than 5 percent of the Daily Value of sodium. Generally, foods with less than 300 milligrams (mg) of sodium per serving fit into this eating plan. To find whole grains, look for the word "whole" as the first word in the ingredient list. Shopping Buy products labeled as  "low-sodium" or "no salt added." Buy fresh foods. Avoid canned foods and pre-made or frozen meals. Cooking Avoid adding salt when cooking. Use salt-free seasonings or herbs instead of table salt or sea salt. Check with your health care provider or pharmacist before using salt substitutes. Do not fry foods. Cook foods using healthy methods such as baking, boiling, grilling, roasting, and broiling instead. Cook with heart-healthy oils, such as olive, canola, avocado, soybean, or sunflower oil. Meal planning  Eat a balanced diet that includes: 4 or more servings of fruits and 4 or more servings of vegetables each day. Try to fill one-half of your plate with fruits and vegetables. 6-8 servings of whole grains each day. Less than 6 oz (170 g) of lean meat, poultry, or fish each day. A 3-oz (85-g) serving of meat is about the same size as a deck of cards. One egg equals 1 oz (28 g). 2-3 servings of low-fat dairy each day. One serving is 1 cup (237 mL). 1 serving of nuts, seeds, or beans 5 times each week. 2-3 servings of heart-healthy fats. Healthy fats called omega-3 fatty acids are found in foods such as walnuts, flaxseeds, fortified milks, and eggs. These fats are also found in cold-water fish, such as sardines, salmon, and mackerel. Limit how much you eat of: Canned or prepackaged foods. Food that is high in trans fat, such as some fried foods. Food that is high in saturated fat, such as fatty meat. Desserts and other sweets, sugary drinks, and other foods with added sugar. Full-fat dairy products. Do not salt foods  before eating. Do not eat more than 4 egg yolks a week. Try to eat at least 2 vegetarian meals a week. Eat more home-cooked food and less restaurant, buffet, and fast food. Lifestyle When eating at a restaurant, ask that your food be prepared with less salt or no salt, if possible. If you drink alcohol: Limit how much you use to: 0-1 drink a day for women who are not  pregnant. 0-2 drinks a day for men. Be aware of how much alcohol is in your drink. In the [place] one drink equals one 12 oz bottle of beer (355 mL), one 5 oz glass of wine (148 mL), or one 1 oz glass of hard liquor (44 mL). General information Avoid eating more than 2,300 mg of salt a day. If you have hypertension, you may need to reduce your sodium intake to 1,500 mg a day. Work with your health care provider to maintain a healthy body weight or to lose weight. Ask what an ideal weight is for you. Get at least 30 minutes of exercise that causes your heart to beat faster (aerobic exercise) most days of the week. Activities may include walking, swimming, or biking. Work with your health care provider or dietitian to adjust your eating plan to your individual calorie needs. What foods should I eat? Fruits All fresh, dried, or frozen fruit. Canned fruit in natural juice (without added sugar). Vegetables Fresh or frozen vegetables (raw, steamed, roasted, or grilled). Low-sodium or reduced-sodium tomato and vegetable juice. Low-sodium or reduced-sodium tomato sauce and tomato paste. Low-sodium or reduced-sodium canned vegetables. Grains Whole-grain or whole-wheat bread. Whole-grain or whole-wheat pasta. Brown rice. [place] Morrow. Bulgur. Whole-grain and low-sodium cereals. Pita bread. Low-fat, low-sodium crackers. Whole-wheat flour tortillas. Meats and other proteins Skinless chicken or [place]. Ground chicken or [place]. Pork with fat trimmed off. Fish and seafood. Egg whites. Dried beans, peas, or lentils. Unsalted nuts, nut butters, and seeds. Unsalted canned beans. Lean cuts of beef with fat trimmed off. Low-sodium, lean precooked or cured meat, such as sausages or meat loaves. Dairy Low-fat (1%) or fat-free (skim) milk. Reduced-fat, low-fat, or fat-free cheeses. Nonfat, low-sodium ricotta or cottage cheese. Low-fat or nonfat yogurt. Low-fat, low-sodium cheese. Fats and oils Soft margarine without  trans fats. Vegetable oil. Reduced-fat, low-fat, or light mayonnaise and salad dressings (reduced-sodium). Canola, safflower, olive, avocado, soybean, and sunflower oils. Avocado. Seasonings and condiments Herbs. Spices. Seasoning mixes without salt. Other foods Unsalted popcorn and pretzels. Fat-free sweets. The items listed above may not be a complete list of foods and beverages you can eat. Contact a dietitian for more information. What foods should I avoid? Fruits Canned fruit in a light or heavy syrup. Fried fruit. Fruit in cream or butter sauce. Vegetables Creamed or fried vegetables. Vegetables in a cheese sauce. Regular canned vegetables (not low-sodium or reduced-sodium). Regular canned tomato sauce and paste (not low-sodium or reduced-sodium). Regular tomato and vegetable juice (not low-sodium or reduced-sodium). Angie Fava. Olives. Grains Baked goods made with fat, such as croissants, muffins, or some breads. Dry pasta or rice meal packs. Meats and other proteins Fatty cuts of meat. Ribs. Fried meat. Berniece Salines. Bologna, salami, and other precooked or cured meats, such as sausages or meat loaves. Fat from the back of a pig (fatback). Bratwurst. Salted nuts and seeds. Canned beans with added salt. Canned or smoked fish. Whole eggs or egg yolks. Chicken or [place] with skin. Dairy Whole or 2% milk, cream, and half-and-half. Whole or full-fat cream cheese. Whole-fat or sweetened  yogurt. Full-fat cheese. Nondairy creamers. Whipped toppings. Processed cheese and cheese spreads. Fats and oils Butter. Stick margarine. Lard. Shortening. Ghee. Bacon fat. Tropical oils, such as coconut, palm kernel, or palm oil. Seasonings and condiments Onion salt, garlic salt, seasoned salt, table salt, and sea salt. Worcestershire sauce. Tartar sauce. Barbecue sauce. Teriyaki sauce. Soy sauce, including reduced-sodium. Steak sauce. Canned and packaged gravies. Fish sauce. Oyster sauce. Cocktail sauce. Store-bought  horseradish. Ketchup. Mustard. Meat flavorings and tenderizers. Bouillon cubes. Hot sauces. Pre-made or packaged marinades. Pre-made or packaged taco seasonings. Relishes. Regular salad dressings. Other foods Salted popcorn and pretzels. The items listed above may not be a complete list of foods and beverages you should avoid. Contact a dietitian for more information. Where to find more information National Heart, Lung, and Blood Institute: PopSteam.is American Heart Association: www.heart.org Academy of Nutrition and Dietetics: www.eatright.org National Kidney Foundation: www.kidney.org Summary The DASH eating plan is a healthy eating plan that has been shown to reduce high blood pressure (hypertension). It may also reduce your risk for type 2 diabetes, heart disease, and stroke. When on the DASH eating plan, aim to eat more fresh fruits and vegetables, whole grains, lean proteins, low-fat dairy, and heart-healthy fats. With the DASH eating plan, you should limit salt (sodium) intake to 2,300 mg a day. If you have hypertension, you may need to reduce your sodium intake to 1,500 mg a day. Work with your health care provider or dietitian to adjust your eating plan to your individual calorie needs. This information is not intended to replace advice given to you by your health care provider. Make sure you discuss any questions you have with your health care provider. Document Revised: 08/16/2019 Document Reviewed: 08/16/2019 Elsevier Patient Education  2023 [employer].

## 2022-07-01 ENCOUNTER — Telehealth: Payer: Self-pay

## 2022-07-01 DIAGNOSIS — Z0000 Encounter for general adult medical examination without abnormal findings: Secondary | ICD-10-CM

## 2022-07-01 LAB — COMPREHENSIVE METABOLIC PANEL
ALT: 29 IU/L (ref 0–44)
AST: 22 IU/L (ref 0–40)
Albumin/Globulin Ratio: 1.6 (ref 1.2–2.2)
Albumin: 4.6 g/dL (ref 4.1–5.1)
Alkaline Phosphatase: 67 IU/L (ref 44–121)
BUN/Creatinine Ratio: 13 (ref 9–20)
BUN: 15 mg/dL (ref 6–20)
Bilirubin Total: 0.6 mg/dL (ref 0.0–1.2)
CO2: 24 mmol/L (ref 20–29)
Calcium: 9.9 mg/dL (ref 8.7–10.2)
Chloride: 101 mmol/L (ref 96–106)
Creatinine, Ser: 1.2 mg/dL (ref 0.76–1.27)
Globulin, Total: 2.9 g/dL (ref 1.5–4.5)
Glucose: 81 mg/dL (ref 70–99)
Potassium: 4.5 mmol/L (ref 3.5–5.2)
Sodium: 139 mmol/L (ref 134–144)
Total Protein: 7.5 g/dL (ref 6.0–8.5)
eGFR: 80 mL/min/{1.73_m2} (ref >59)

## 2022-07-01 LAB — HEMOGLOBIN A1C
Est. average glucose Bld gHb Est-mCnc: 97 mg/dL
Hgb A1c MFr Bld: 5 % (ref 4.8–5.6)

## 2022-07-01 LAB — LDL CHOLESTEROL, DIRECT: LDL Direct: 96 mg/dL (ref 0–99)

## 2022-07-01 LAB — TSH: TSH: 1.89 u[IU]/mL (ref 0.450–4.500)

## 2022-07-01 NOTE — Telephone Encounter (Signed)
Called patient per health coaching referral for stress management from Laurann Montana, NP. Patient did not answer. Left a message for patient to return call to discuss health coaching in more detail.   Jacob Lloyd [facility] Guide, Health Coach [address] [place] [address] Telephone: [phone] Email: [email or web address]

## 2022-07-22 ENCOUNTER — Ambulatory Visit (INDEPENDENT_AMBULATORY_CARE_PROVIDER_SITE_OTHER): Payer: BC Managed Care – PPO

## 2022-07-22 ENCOUNTER — Telehealth (HOSPITAL_BASED_OUTPATIENT_CLINIC_OR_DEPARTMENT_OTHER): Payer: Self-pay

## 2022-07-22 DIAGNOSIS — R0609 Other forms of dyspnea: Secondary | ICD-10-CM

## 2022-07-22 DIAGNOSIS — I10 Essential (primary) hypertension: Secondary | ICD-10-CM

## 2022-07-22 LAB — ECHOCARDIOGRAM COMPLETE
AR max vel: 3.07 cm2
AV Area VTI: 3.54 cm2
AV Area mean vel: 2.67 cm2
AV Mean grad: 4 mmHg
AV Peak grad: 7.8 mmHg
Ao pk vel: 1.4 m/s
Area-P 1/2: 4.36 cm2
Calc EF: 49.5 %
S' Lateral: 4.78 cm
Single Plane A2C EF: 47 %
Single Plane A4C EF: 49.3 %

## 2022-07-22 MED ORDER — PERFLUTREN LIPID MICROSPHERE
1.0000 mL | INTRAVENOUS | Status: AC | PRN
  Administered 2022-07-22: 3 mL via INTRAVENOUS

## 2022-07-22 NOTE — Telephone Encounter (Addendum)
Seen by patient Jacob Lloyd on 07/22/2022  1:14 PM; follow up mychart message sent to patient   ----- Message from Loel Dubonnet, NP sent at 07/22/2022  1:07 PM EDT ----- Echocardiogram with low normal heart muscle function. No significant valvular abnormalities. Continue current medications. Recommend low salt diet, regular cardiovascular exercise, and restrict to <2L fluid intake per day.   If we can please get an update on BP readings from him, please.

## 2022-08-04 ENCOUNTER — Ambulatory Visit: Payer: BC Managed Care – PPO

## 2022-08-04 NOTE — Progress Notes (Deleted)
     08/04/2022 Jacob Lloyd [birth year] [id number]   HPI:  Jacob Lloyd is a 36 y.o. male patient of Dr Duke Salvia, with a PMH below who presents today for advanced hypertension clinic follow up.  Patient was referred to our clinic by Delice Bison PA.  He was most recently seen in the ED in August because of hypertension and SOB.  He was treated for pneumonia and referred to cardiology because of elevated BNP (131.5) and enlarged cardiac silhouette.   He was seen by Gillian Shields NP in the [facility].  He noted hypertension for at least the past 10 years, difficult to control.   Past Medical History: OSA Uses BIPAP regularly  obesity BMI 56.87 (68" 169.6 kg)     Blood Pressure Goal:  130/80  Current Medications: amlodipine-olmesartan 5/40 qd, furosemide 20 mg qd   Family Hx:     Social Hx: no tobacco, smokes marijuana;      Diet:      Exercise:   Home BP readings:      Intolerances:  nkda  Labs: 06/30/22:  Na 139, K 4.5, Glu 81, BUN 15, SCr 1.20, GFR 80   Wt Readings from Last 3 Encounters:  06/30/22 (!) 374 lb (169.6 kg)  04/30/22 (!) 370 lb (167.8 kg)  04/10/18 (!) 376 lb (170.6 kg)   BP Readings from Last 3 Encounters:  06/30/22 (!) 159/111  04/30/22 129/80  04/10/18 (!) 148/97   Pulse Readings from Last 3 Encounters:  06/30/22 91  04/30/22 89  04/10/18 95    Current Outpatient Medications  Medication Sig Dispense Refill   amLODipine-olmesartan (AZOR) 5-40 MG tablet Take 1 tablet by mouth daily. 30 tablet 2   furosemide (LASIX) 20 MG tablet Take 1 tablet (20 mg total) by mouth daily. 30 tablet 2   No current facility-administered medications for this visit.    No Known Allergies  Past Medical History:  Diagnosis Date   Hypertension    Sleep apnea     There were no vitals taken for this visit.  No BP recorded.  {Refresh Note OR Click here to enter BP  :1}***    No problem-specific Assessment & Plan notes found for this encounter.   Phillips Hay PharmD CPP [facility] [address] [place], [place] [address] [phone]

## 2022-09-09 ENCOUNTER — Other Ambulatory Visit (HOSPITAL_BASED_OUTPATIENT_CLINIC_OR_DEPARTMENT_OTHER): Payer: Self-pay | Admitting: Family

## 2022-09-09 NOTE — Telephone Encounter (Signed)
Rx(s) sent to pharmacy electronically.  

## 2022-10-07 ENCOUNTER — Other Ambulatory Visit (HOSPITAL_BASED_OUTPATIENT_CLINIC_OR_DEPARTMENT_OTHER): Payer: Self-pay | Admitting: Family

## 2022-10-07 NOTE — Telephone Encounter (Signed)
Rx(s) sent to pharmacy electronically.  

## 2023-05-07 ENCOUNTER — Other Ambulatory Visit: Payer: Self-pay

## 2023-05-07 ENCOUNTER — Emergency Department (HOSPITAL_COMMUNITY)
Admission: EM | Admit: 2023-05-07 | Discharge: 2023-05-07 | Disposition: A | Discharge status: Home / Self Care | Payer: BC Managed Care – PPO | Source: Home / Self Care | Attending: Emergency Medicine | Admitting: Emergency Medicine

## 2023-05-07 ENCOUNTER — Emergency Department (HOSPITAL_COMMUNITY): Payer: BC Managed Care – PPO

## 2023-05-07 DIAGNOSIS — Z20822 Contact with and (suspected) exposure to covid-19: Secondary | ICD-10-CM | POA: Insufficient documentation

## 2023-05-07 DIAGNOSIS — R112 Nausea with vomiting, unspecified: Secondary | ICD-10-CM | POA: Diagnosis not present

## 2023-05-07 DIAGNOSIS — R066 Hiccough: Secondary | ICD-10-CM | POA: Insufficient documentation

## 2023-05-07 DIAGNOSIS — R1013 Epigastric pain: Secondary | ICD-10-CM | POA: Diagnosis not present

## 2023-05-07 DIAGNOSIS — R109 Unspecified abdominal pain: Secondary | ICD-10-CM | POA: Diagnosis not present

## 2023-05-07 LAB — COMPREHENSIVE METABOLIC PANEL
ALT: 25 U/L (ref 0–44)
AST: 19 U/L (ref 15–41)
Albumin: 4.2 g/dL (ref 3.5–5.0)
Alkaline Phosphatase: 58 U/L (ref 38–126)
Anion gap: 9 (ref 5–15)
BUN: 14 mg/dL (ref 6–20)
CO2: 27 mmol/L (ref 22–32)
Calcium: 9.9 mg/dL (ref 8.9–10.3)
Chloride: 101 mmol/L (ref 98–111)
Creatinine, Ser: 1.14 mg/dL (ref 0.61–1.24)
GFR, Estimated: >60 mL/min (ref >60)
Glucose, Bld: 140 mg/dL — ABNORMAL HIGH (ref 70–99)
Potassium: 3.9 mmol/L (ref 3.5–5.1)
Sodium: 137 mmol/L (ref 135–145)
Total Bilirubin: 0.8 mg/dL (ref 0.3–1.2)
Total Protein: 8.3 g/dL — ABNORMAL HIGH (ref 6.5–8.1)

## 2023-05-07 LAB — URINALYSIS, ROUTINE W REFLEX MICROSCOPIC
Bacteria, UA: NONE SEEN
Bilirubin Urine: NEGATIVE
Glucose, UA: NEGATIVE mg/dL
Hgb urine dipstick: NEGATIVE
Ketones, ur: NEGATIVE mg/dL
Leukocytes,Ua: NEGATIVE
Nitrite: NEGATIVE
Protein, ur: 30 mg/dL — AB
Specific Gravity, Urine: 1.017 (ref 1.005–1.030)
pH: 6 (ref 5.0–8.0)

## 2023-05-07 LAB — CBC
HCT: 43.9 % (ref 39.0–52.0)
Hemoglobin: 13.9 g/dL (ref 13.0–17.0)
MCH: 29.8 pg (ref 26.0–34.0)
MCHC: 31.7 g/dL (ref 30.0–36.0)
MCV: 94.2 fL (ref 80.0–100.0)
Platelets: 315 10*3/uL (ref 150–400)
RBC: 4.66 MIL/uL (ref 4.22–5.81)
RDW: 12 % (ref 11.5–15.5)
WBC: 10.6 10*3/uL — ABNORMAL HIGH (ref 4.0–10.5)
nRBC: 0 % (ref 0.0–0.2)

## 2023-05-07 LAB — RESP PANEL BY RT-PCR (RSV, FLU A&B, COVID)  RVPGX2
Influenza A by PCR: NEGATIVE
Influenza B by PCR: NEGATIVE
Resp Syncytial Virus by PCR: NEGATIVE
SARS Coronavirus 2 by RT PCR: NEGATIVE

## 2023-05-07 LAB — LIPASE, BLOOD: Lipase: 26 U/L (ref 11–51)

## 2023-05-07 MED ORDER — CHLORPROMAZINE HCL 25 MG PO TABS
25.0000 mg | ORAL_TABLET | Freq: Once | ORAL | Status: AC
  Administered 2023-05-07: 25 mg via ORAL
  Filled 2023-05-07: qty 1

## 2023-05-07 MED ORDER — LACTATED RINGERS IV BOLUS
500.0000 mL | Freq: Once | INTRAVENOUS | Status: AC
  Administered 2023-05-07: 500 mL via INTRAVENOUS

## 2023-05-07 MED ORDER — ALUM & MAG HYDROXIDE-SIMETH 200-200-20 MG/5ML PO SUSP
30.0000 mL | Freq: Once | ORAL | Status: AC
  Administered 2023-05-07: 30 mL via ORAL
  Filled 2023-05-07: qty 30

## 2023-05-07 MED ORDER — ONDANSETRON 8 MG PO TBDP: 8.0000 mg | ORAL_TABLET | Freq: Three times a day (TID) | ORAL | 0 refills | Status: DC | PRN

## 2023-05-07 MED ORDER — LORAZEPAM 2 MG/ML IJ SOLN
0.5000 mg | Freq: Once | INTRAMUSCULAR | Status: AC
  Administered 2023-05-07: 0.5 mg via INTRAVENOUS
  Filled 2023-05-07: qty 1

## 2023-05-07 MED ORDER — IOHEXOL 300 MG/ML  SOLN
100.0000 mL | Freq: Once | INTRAMUSCULAR | Status: AC | PRN
  Administered 2023-05-07: 100 mL via INTRAVENOUS

## 2023-05-07 MED ORDER — ONDANSETRON HCL 4 MG/2ML IJ SOLN
4.0000 mg | Freq: Once | INTRAMUSCULAR | Status: AC
  Administered 2023-05-07: 4 mg via INTRAVENOUS
  Filled 2023-05-07: qty 2

## 2023-05-07 NOTE — ED Provider Notes (Signed)
[place] EMERGENCY DEPARTMENT AT [facility] Provider Note   CSN: [id number] Arrival date & time: 05/07/23  0865     History  Chief Complaint  Patient presents with   Abdominal Pain   Nausea   Emesis    Jacob Lloyd is a 37 y.o. male.  HPI 37 yo male nausea, cold sweats, epigastric pain in waves, vomited x 5 nbnb, no diarrhea, no fever. Patient has hadsome episodes in the past that were self limiting. No abdominal surgerie No chest pain or dyspnea. Patient had one misced  drink before symptoms.  No regular etoh. No sick contacts.      Home Medications Prior to Admission medications   Medication Sig Start Date End Date Taking? Authorizing Provider  ondansetron (ZOFRAN-ODT) 8 MG disintegrating tablet Take 1 tablet (8 mg total) by mouth every 8 (eight) hours as needed for nausea or vomiting. 05/07/23  Yes Kathlyn Leachman, Duwayne Heck, MD  amLODipine-olmesartan (AZOR) 5-40 MG tablet Take 1 tablet by mouth daily. Need appointment 09/09/22   Alver Sorrow, NP  furosemide (LASIX) 20 MG tablet TAKE 1 TABLET(20 MG) BY MOUTH DAILY 10/07/22   Alver Sorrow, NP      Allergies    Patient has no known allergies.    Review of Systems   Review of Systems  Physical Exam Updated Vital Signs BP (!) 168/99 (BP Location: Left Arm)   Pulse 94   Temp 98.2 F (36.8 C) (Oral)   Resp 20   Ht 1.727 m (5\' 8" )   Wt (!) 169.6 kg   SpO2 99%   BMI 56.87 kg/m  Physical Exam Vitals and nursing note reviewed.  Constitutional:      Appearance: He is obese.  HENT:     Head: Normocephalic.     Mouth/Throat:     Mouth: Mucous membranes are moist.  Eyes:     Extraocular Movements: Extraocular movements intact.  Cardiovascular:     Rate and Rhythm: Normal rate and regular rhythm.  Abdominal:     General: Abdomen is protuberant. Bowel sounds are normal.     Palpations: Abdomen is soft.     Tenderness: There is no abdominal tenderness.     Hernia: No hernia is present.   Neurological:     General: No focal deficit present.     Mental Status: He is alert.  Psychiatric:        Mood and Affect: Mood normal.     ED Results / Procedures / Treatments   Labs (all labs ordered are listed, but only abnormal results are displayed) Labs Reviewed  COMPREHENSIVE METABOLIC PANEL - Abnormal; Notable for the following components:      Result Value   Glucose, Bld 140 (*)    Total Protein 8.3 (*)    All other components within normal limits  CBC - Abnormal; Notable for the following components:   WBC 10.6 (*)    All other components within normal limits  URINALYSIS, ROUTINE W REFLEX MICROSCOPIC - Abnormal; Notable for the following components:   Protein, ur 30 (*)    All other components within normal limits  RESP PANEL BY RT-PCR (RSV, FLU A&B, COVID)  RVPGX2  LIPASE, BLOOD    EKG None  Radiology CT ABDOMEN PELVIS W CONTRAST  Result Date: 05/07/2023 CLINICAL DATA:  Abdominal pain.  Acute nonlocalized EXAM: CT ABDOMEN AND PELVIS WITH CONTRAST TECHNIQUE: Multidetector CT imaging of the abdomen and pelvis was performed using the standard protocol following bolus administration of  intravenous contrast. RADIATION DOSE REDUCTION: This exam was performed according to the departmental dose-optimization program which includes automated exposure control, adjustment of the mA and/or kV according to patient size and/or use of iterative reconstruction technique. CONTRAST:  OMNIPAQUE IOHEXOL 300 MG/ML  SOLN COMPARISON:  None Available. FINDINGS: Lower chest: Lung bases are clear. Hepatobiliary: No focal hepatic lesion. Normal gallbladder. No biliary duct dilatation. Common bile duct is normal. Pancreas: Pancreas is normal. No ductal dilatation. No pancreatic inflammation. Spleen: Normal spleen Adrenals/urinary tract: Adrenal glands and kidneys are normal. The ureters and bladder normal. Stomach/Bowel: Stomach, small bowel, appendix, and cecum are normal. The colon and  rectosigmoid colon are normal. Vascular/Lymphatic: Abdominal aorta is normal caliber. No periportal or retroperitoneal adenopathy. No pelvic adenopathy. Reproductive: Prostate unremarkable Other: No free fluid. Musculoskeletal: No aggressive osseous lesion. IMPRESSION: 1. No acute findings in the abdomen pelvis. 2. Normal appendix. 3. Normal gallbladder. Electronically Signed   By: Genevive Bi M.D.   On: 05/07/2023 14:51    Procedures Procedures    Medications Ordered in ED Medications  ondansetron (ZOFRAN) injection 4 mg (4 mg Intravenous Given 05/07/23 0928)  lactated ringers bolus 500 mL (0 mLs Intravenous Stopped 05/07/23 1152)  alum & mag hydroxide-simeth (MAALOX/MYLANTA) 200-200-20 MG/5ML suspension 30 mL (30 mLs Oral Given 05/07/23 0921)  LORazepam (ATIVAN) injection 0.5 mg (0.5 mg Intravenous Given 05/07/23 1315)  chlorproMAZINE (THORAZINE) tablet 25 mg (25 mg Oral Given 05/07/23 1455)  iohexol (OMNIPAQUE) 300 MG/ML solution 100 mL (100 mLs Intravenous Contrast Given 05/07/23 1427)    ED Course/ Medical Decision Making/ A&P                                 Medical Decision Making Amount and/or Complexity of Data Reviewed Labs: ordered.  Risk OTC drugs. Prescription drug management.  Presents today with hiccups, nausea, vomiting.  Patient evaluated with labs with complete metabolic panel with mild hyperglycemia, CBC with mild leukocytosis with white count 10,600 normal lipase COVID flu RSV negative Urinalysis without any evidence of acute abnormality Patient received Ativan and Thorazine for hiccups.  He is currently not having hiccups.  CT was obtained that shows no evidence of acute abnormality Plan Phenergan for nausea and vomiting Discussed return precautions need for follow-up patient voices understanding         Final Clinical Impression(s) / ED Diagnoses Final diagnoses:  Nausea and vomiting, unspecified vomiting type  Hiccoughs    Rx / DC Orders ED  Discharge Orders          Ordered    ondansetron (ZOFRAN-ODT) 8 MG disintegrating tablet  Every 8 hours PRN        05/07/23 1459              Margarita Grizzle, MD 05/07/23 1500

## 2023-05-07 NOTE — ED Triage Notes (Signed)
Pt arrived via POV. C/o N/V and abd pain since last night.  No known sick contacts.  AOx4

## 2023-05-07 NOTE — ED Notes (Signed)
Pt seemed lethargic prior to d/c. Pt stated he normally wears "the mask to sleep" and has sleep apnea and has not been able to sleep as well over the past few nights. Pt denies any drug or alcohol use. Pt ambulated around the room, steady on his feet. Pt stated he felt okay enough to drive and verbalized understanding of driving behind the wheel in a lethargic state. RN and MD felt comfortable enough to d/c pt.

## 2023-07-09 ENCOUNTER — Other Ambulatory Visit: Payer: Self-pay

## 2023-07-09 ENCOUNTER — Emergency Department (HOSPITAL_COMMUNITY)
Admission: EM | Admit: 2023-07-09 | Discharge: 2023-07-09 | Disposition: A | Discharge status: Home / Self Care | Payer: BC Managed Care – PPO | Attending: Emergency Medicine | Admitting: Emergency Medicine

## 2023-07-09 DIAGNOSIS — X58XXXA Exposure to other specified factors, initial encounter: Secondary | ICD-10-CM | POA: Diagnosis not present

## 2023-07-09 DIAGNOSIS — S80811A Abrasion, right lower leg, initial encounter: Secondary | ICD-10-CM | POA: Diagnosis not present

## 2023-07-09 NOTE — Discharge Instructions (Signed)
Evaluation today revealed he had a small abrasion on the right lower leg.  This is likely the source of your bleeding.  Recommend you keep it cleaned and covered with a Band-Aid for the next couple days.  Recommend you follow with your PCP.

## 2023-07-09 NOTE — ED Provider Notes (Signed)
[place] EMERGENCY DEPARTMENT AT [facility] Provider Note   CSN: [id number] Arrival date & time: 07/09/23  0020     History  No chief complaint on file.  HPI Jacob Lloyd is a 37 y.o. male presenting for leg abrasion.  States he got out of the shower and noticed that his lower right leg was bleeding.  Denies cutting himself or any trauma to the leg.  States the bleeding would not stop which prompted his evaluation here today.  He put a Band-Aid on it and came to the ED and the bleeding had stopped sometime on his way here.  Denies any bleeding disorders.  Denies shortness of breath, fatigue.  HPI     Home Medications Prior to Admission medications   Medication Sig Start Date End Date Taking? Authorizing Provider  amLODipine-olmesartan (AZOR) 5-40 MG tablet Take 1 tablet by mouth daily. Need appointment 09/09/22   Alver Sorrow, NP  furosemide (LASIX) 20 MG tablet TAKE 1 TABLET(20 MG) BY MOUTH DAILY 10/07/22   Alver Sorrow, NP  ondansetron (ZOFRAN-ODT) 8 MG disintegrating tablet Take 1 tablet (8 mg total) by mouth every 8 (eight) hours as needed for nausea or vomiting. 05/07/23   Margarita Grizzle, MD      Allergies    Patient has no known allergies.    Review of Systems   See HPI for pertinent positives  Physical Exam Updated Vital Signs BP (!) 177/110 (BP Location: Right Arm)   Pulse 98   Temp 98 F (36.7 C) (Oral)   Resp 18   Ht 5\' 8"  (1.727 m)   Wt (!) 172.4 kg   SpO2 98%   BMI 57.78 kg/m  Physical Exam Constitutional:      Appearance: Normal appearance.  HENT:     Head: Normocephalic.     Nose: Nose normal.  Eyes:     Conjunctiva/sclera: Conjunctivae normal.  Pulmonary:     Effort: Pulmonary effort is normal.  Musculoskeletal:     Comments: Small abrasion noted to the right lateral lower leg.  Not actively bleeding.  No surrounding erythema or edema.  Old blood noted at the site.  Neurological:     Mental Status: He is alert.   Psychiatric:        Mood and Affect: Mood normal.     ED Results / Procedures / Treatments   Labs (all labs ordered are listed, but only abnormal results are displayed) Labs Reviewed - No data to display  EKG None  Radiology No results found.  Procedures Procedures    Medications Ordered in ED Medications - No data to display  ED Course/ Medical Decision Making/ A&P                                 Medical Decision Making  38 year old well-appearing male presenting for leg bleeding.  Exam notable for small abrasion to the right lower lateral leg with evidence of old blood but not actively bleeding.  This is likely the source of the bleeding.  Hemostasis and bleed was achieved with a Band-Aid and some direct pressure.  No suggestions of symptomatic anemia at this time.  Advised to follow-up with his PCP.  Cleaned the wound and applied a dressing.  Vital stable.  Discharge.        Final Clinical Impression(s) / ED Diagnoses Final diagnoses:  Abrasion of right lower extremity, initial encounter  Rx / DC Orders ED Discharge Orders     None         Gareth Eagle, PA-C 07/09/23 0105    Dione Booze, MD 07/09/23 [phone]

## 2023-07-09 NOTE — ED Triage Notes (Signed)
Pt sts he noticed his right leg bleeding after getting out of the shower. Did not fall or cut himself. Sts he was concerns as bleeding would not stop and applied pressure dressing. In triage bleeding controlled.

## 2023-07-17 ENCOUNTER — Other Ambulatory Visit: Payer: Self-pay

## 2023-07-17 ENCOUNTER — Emergency Department (HOSPITAL_COMMUNITY): Payer: BC Managed Care – PPO

## 2023-07-17 ENCOUNTER — Emergency Department (HOSPITAL_COMMUNITY)
Admission: EM | Admit: 2023-07-17 | Discharge: 2023-07-17 | Disposition: A | Discharge status: Home / Self Care | Payer: BC Managed Care – PPO | Attending: Emergency Medicine | Admitting: Emergency Medicine

## 2023-07-17 ENCOUNTER — Encounter (HOSPITAL_COMMUNITY): Payer: Self-pay

## 2023-07-17 DIAGNOSIS — M7989 Other specified soft tissue disorders: Secondary | ICD-10-CM | POA: Diagnosis not present

## 2023-07-17 DIAGNOSIS — M109 Gout, unspecified: Secondary | ICD-10-CM

## 2023-07-17 DIAGNOSIS — M25572 Pain in left ankle and joints of left foot: Secondary | ICD-10-CM | POA: Diagnosis not present

## 2023-07-17 DIAGNOSIS — X501XXA Overexertion from prolonged static or awkward postures, initial encounter: Secondary | ICD-10-CM | POA: Diagnosis not present

## 2023-07-17 DIAGNOSIS — M10072 Idiopathic gout, left ankle and foot: Secondary | ICD-10-CM | POA: Diagnosis not present

## 2023-07-17 DIAGNOSIS — M7732 Calcaneal spur, left foot: Secondary | ICD-10-CM | POA: Diagnosis not present

## 2023-07-17 MED ORDER — COLCHICINE 0.6 MG PO TABS
1.2000 mg | ORAL_TABLET | Freq: Once | ORAL | Status: AC
  Administered 2023-07-17: 1.2 mg via ORAL
  Filled 2023-07-17: qty 2

## 2023-07-17 MED ORDER — ONDANSETRON 8 MG PO TBDP
8.0000 mg | ORAL_TABLET | Freq: Once | ORAL | Status: AC
  Administered 2023-07-17: 8 mg via ORAL
  Filled 2023-07-17: qty 1

## 2023-07-17 MED ORDER — PREDNISONE 10 MG (21) PO TBPK
ORAL_TABLET | Freq: Every day | ORAL | 0 refills | Status: DC
Start: 2023-07-17 — End: 2023-10-03

## 2023-07-17 MED ORDER — HYDROCODONE-ACETAMINOPHEN 5-325 MG PO TABS
1.0000 | ORAL_TABLET | Freq: Once | ORAL | Status: AC
  Administered 2023-07-17: 1 via ORAL
  Filled 2023-07-17: qty 1

## 2023-07-17 MED ORDER — COLCHICINE 0.6 MG PO TABS: 0.6000 mg | ORAL_TABLET | Freq: Every day | ORAL | 0 refills | Status: DC

## 2023-07-17 MED ORDER — METHYLPREDNISOLONE SODIUM SUCC 125 MG IJ SOLR
125.0000 mg | Freq: Once | INTRAMUSCULAR | Status: AC
  Administered 2023-07-17: 125 mg via INTRAMUSCULAR
  Filled 2023-07-17: qty 2

## 2023-07-17 NOTE — ED Triage Notes (Signed)
Endorses R ankle pain since Saturday night. States had been doing a lot of walking.No known injury. Slight swelling to R ankle.  No other complaints

## 2023-07-17 NOTE — ED Provider Notes (Signed)
[address] EMERGENCY DEPARTMENT AT [facility] Provider Note   CSN: [id number] Arrival date & time: 07/17/23  1029     History  Chief Complaint  Patient presents with   Ankle Pain    Jacob Lloyd is a 37 y.o. male.  37 year old male presents today for evaluation of left ankle pain.  Started this morning.  Recently went to homecoming and had all cold beverages.  Does have history of gout.  No injury to the left ankle.  No fever.  No open wounds.  The history is provided by the patient. No language interpreter was used.       Home Medications Prior to Admission medications   Medication Sig Start Date End Date Taking? Authorizing Provider  amLODipine-olmesartan (AZOR) 5-40 MG tablet Take 1 tablet by mouth daily. Need appointment 09/09/22   Alver Sorrow, NP  furosemide (LASIX) 20 MG tablet TAKE 1 TABLET(20 MG) BY MOUTH DAILY 10/07/22   Alver Sorrow, NP  ondansetron (ZOFRAN-ODT) 8 MG disintegrating tablet Take 1 tablet (8 mg total) by mouth every 8 (eight) hours as needed for nausea or vomiting. 05/07/23   Margarita Grizzle, MD      Allergies    Patient has no known allergies.    Review of Systems   Review of Systems  Constitutional:  Negative for chills and fever.  Musculoskeletal:  Positive for arthralgias.  All other systems reviewed and are negative.   Physical Exam Updated Vital Signs BP (!) 163/99 (BP Location: Left Arm)   Pulse (!) 103   Temp 99.4 F (37.4 C) (Oral)   Resp 20   Ht 5\' 8"  (1.727 m)   Wt (!) 181.4 kg   SpO2 100%   BMI 60.82 kg/m  Physical Exam Vitals and nursing note reviewed.  Constitutional:      General: He is not in acute distress.    Appearance: Normal appearance. He is not ill-appearing.  HENT:     Head: Normocephalic and atraumatic.     Nose: Nose normal.  Eyes:     Conjunctiva/sclera: Conjunctivae normal.  Pulmonary:     Effort: Pulmonary effort is normal. No respiratory distress.  Musculoskeletal:         General: No deformity.     Comments: Tenderness palpation over the medial aspect of left ankle.  Limited range of motion secondary to pain.  Neurovascularly intact.  No overlying erythema.  Skin:    Findings: No rash.  Neurological:     Mental Status: He is alert.     ED Results / Procedures / Treatments   Labs (all labs ordered are listed, but only abnormal results are displayed) Labs Reviewed - No data to display  EKG None  Radiology DG Ankle Complete Left  Result Date: 07/17/2023 CLINICAL DATA:  Pain and swelling. EXAM: LEFT ANKLE COMPLETE - 3 VIEW COMPARISON:  None Available. FINDINGS: No acute fracture or dislocation. Soft tissue swelling about the ankle. Previous ORIF with lateral fixation plate and screws along the distal fibula and 2 adjacent oblique screws along the medial malleolus. No obvious hardware failure. Tiny Achilles calcaneal spur IMPRESSION: No acute osseous abnormality. Prior ORIF of the distal fibula and medial malleolus. Calcaneal spur Electronically Signed   By: Karen Kays M.D.   On: 07/17/2023 13:59    Procedures Procedures    Medications Ordered in ED Medications  methylPREDNISolone sodium succinate (SOLU-MEDROL) 125 mg/2 mL injection 125 mg (has no administration in time range)  colchicine tablet 1.2 mg (  has no administration in time range)  ondansetron (ZOFRAN-ODT) disintegrating tablet 8 mg (8 mg Oral Given 07/17/23 1334)  HYDROcodone-acetaminophen (NORCO/VICODIN) 5-325 MG per tablet 1 tablet (1 tablet Oral Given 07/17/23 1334)    ED Course/ Medical Decision Making/ A&P                                 Medical Decision Making Amount and/or Complexity of Data Reviewed Radiology: ordered.  Risk Prescription drug management.   37 year old male presents today for concern of left ankle pain.  Does have history of gout.  Gout versus septic arthritis considered.  No open wounds.  Afebrile.  Does have history of gout.  Discussion had regarding  arthrocentesis and empiric management.  Patient is agreeable.  X-ray obtained.  No acute bony abnormality.  Personally reviewed as well.  Solu-Medrol, colchicine given in the emergency department.  Prednisone Dosepak as well as an additional dose of colchicine prescribed.  Patient discharged in stable condition.  Return precautions discussed.   Final Clinical Impression(s) / ED Diagnoses Final diagnoses:  Acute gout of left ankle, unspecified cause    Rx / DC Orders ED Discharge Orders          Ordered    colchicine 0.6 MG tablet  Daily        07/17/23 1421    predniSONE (STERAPRED UNI-PAK 21 TAB) 10 MG (21) TBPK tablet  Daily        07/17/23 1421              Marita Kansas, PA-C 07/17/23 1422    Cathren Laine, MD 07/17/23 2133

## 2023-07-17 NOTE — Discharge Instructions (Signed)
You likely have a gout attack.  Follow-up with your primary care provider.  I have sent steroids into the pharmacy along with colchicine.  Take the colchicine soon as you pick it up 1 hour from the previous dose that she got in the emergency department.  Start the steroids tomorrow given that you received a steroid shot in the emergency department.  If you have any concerning or worrisome symptoms return to the emergency room.

## 2023-10-03 ENCOUNTER — Ambulatory Visit (INDEPENDENT_AMBULATORY_CARE_PROVIDER_SITE_OTHER): Payer: Medicaid Other | Admitting: Family

## 2023-10-03 ENCOUNTER — Encounter (HOSPITAL_BASED_OUTPATIENT_CLINIC_OR_DEPARTMENT_OTHER): Payer: Self-pay | Admitting: Family

## 2023-10-03 VITALS — BP 148/98 | HR 90 | Resp 16 | Ht 68.0 in | Wt 394.0 lb

## 2023-10-03 DIAGNOSIS — Z6841 Body Mass Index (BMI) 40.0 and over, adult: Secondary | ICD-10-CM

## 2023-10-03 DIAGNOSIS — I10 Essential (primary) hypertension: Secondary | ICD-10-CM | POA: Diagnosis not present

## 2023-10-03 DIAGNOSIS — G4733 Obstructive sleep apnea (adult) (pediatric): Secondary | ICD-10-CM | POA: Diagnosis not present

## 2023-10-03 MED ORDER — AMLODIPINE-OLMESARTAN 5-40 MG PO TABS: 1.0000 | ORAL_TABLET | Freq: Every day | ORAL | 1 refills | Status: DC

## 2023-10-03 NOTE — Patient Instructions (Addendum)
 Medication Instructions:   RESUME Amlodipine -Olmesartan  5-40mg  daily   *If you need a refill on your cardiac medications before your next appointment, please call your pharmacy*  Lab Work: Your physician recommends that you return for lab work in: 1-2 weeks for CMP, A1c, renin aldosterone   If you have labs (blood work) drawn today and your tests are completely normal, you will receive your results only by: MyChart Message (if you have MyChart) OR A paper copy in the mail If you have any lab test that is abnormal or we need to change your treatment, we will call you to review the results.  Testing/Procedures: Your EKG looked good!  Follow-Up: At [facility], you and your health needs are our priority.  As part of our continuing mission to provide you with exceptional heart care, we have created designated Provider Care Teams.  These Care Teams include your primary Cardiologist (physician) and Advanced Practice Providers (APPs -  Physician Assistants and Nurse Practitioners) who all work together to provide you with the care you need, when you need it.  We recommend signing up for the patient portal called MyChart.  Sign up information is provided on this After Visit Summary.  MyChart is used to connect with patients for Virtual Visits (Telemedicine).  Patients are able to view lab/test results, encounter notes, upcoming appointments, etc.  Non-urgent messages can be sent to your provider as well.   To learn more about what you can do with MyChart, go to [email or web address].    Your next appointment:   4-6 weeks with Hypertension Clinic  Other Instructions  We put in a referral to get your rescheduled with Dr. Chalice for management of sleep apnea: [facility] [phone] [address] [place], [place] [address] [phone]

## 2023-10-03 NOTE — Progress Notes (Signed)
 Advanced Hypertension Clinic Assessment:    Date:  10/03/2023   ID:  Jacob Lloyd, DOB Mar 20, 1986, MRN [id number]  PCP:  Patient, No Pcp Per  Cardiologist:  None  Nephrologist:  Referring MD: [facility]*   CC: Hypertension  History of Present Illness:    Jacob Lloyd is a 38 y.o. male with a hx of HTN, sleep apnea, gout here to follow up in the Advanced Hypertension Clinic.   Previously seen by cardiology in [place], [place] in 2015. Does not recall details of prior workup.   Established with Advanced Hypertension Clinic 06/2022 after ED visit 04/2022 with hypertension. Afshin Chrystal was diagnosed with hypertension > 10 years ago. It has been difficult to control. He was noted to smoke marijuana daily.  Alcohol use socially. Nor formal exercise routine but walked at work in naval architect. He was wearing BIPAP routinely.  Lisinopril  was transitioned to amlodipine -olmesartan . Carotid duplex 06/2022 no carotid stenosis. Echo 06/2023 low normal LVEF 50-55%, no significant valvular abnormalities.   Presents today for follow up. Reports occasional left sided chest pain when sitting that self resolves after a few moments which he attributes to indigestion. Weight up 20 lbs from clinic visit which he attributes to dietary indiscretion while travelling a lot for work. When asked about his BIPAP he reports last sleep study was years ago, will refer to neurology to re-establish care and BIPAP management. Lasix  on his med list but not taking recently. Notes difficulties with gout - has taken allopurinol  in the past - does not presently have PCP and we discussed getting re-established. Has been out of amlodipine -olmesartan  for greater than 6 months. Enjoys spending time with his 12 month old son. Recently got a promotion and will be travelling less.   Previous antihypertensives: Lisinopril -HCTZ - transitioned to Lisinopril  and Chlorthalidone   Past Medical History:  Diagnosis Date    Hypertension    Sleep apnea     Past Surgical History:  Procedure Laterality Date   EYE SURGERY     FRACTURE SURGERY Left    ankle surgery   TONSILLECTOMY  2005    Current Medications: Current Meds  Medication Sig   amLODipine -olmesartan  (AZOR ) 5-40 MG tablet Take 1 tablet by mouth daily. Need appointment   colchicine  0.6 MG tablet Take 1 tablet (0.6 mg total) by mouth daily. Take this tablet 1 hour from the dose you are given in the emergency department.   furosemide  (LASIX ) 20 MG tablet TAKE 1 TABLET(20 MG) BY MOUTH DAILY   ondansetron  (ZOFRAN -ODT) 8 MG disintegrating tablet Take 1 tablet (8 mg total) by mouth every 8 (eight) hours as needed for nausea or vomiting.     Allergies:   Patient has no known allergies.   Social History   Socioeconomic History   Marital status: Single    Spouse name: Not on file   Number of children: 2   Years of education: Not on file   Highest education level: Not on file  Occupational History   Occupation: Shipping and Receiving   Tobacco Use   Smoking status: Former    Current packs/day: 0.00    Types: Cigarettes    Quit date: 2016    Years since quitting: 9.0   Smokeless tobacco: Never  Vaping Use   Vaping status: Never Used  Substance and Sexual Activity   Alcohol use: Yes    Comment: WEEKENDS   Drug use: No   Sexual activity: Yes    Birth control/protection: None  Other Topics Concern  Not on file  Social History Narrative   Not on file   Social Drivers of Health   Financial Resource Strain: Low Risk  (06/30/2022)   Overall Financial Resource Strain (CARDIA)    Difficulty of Paying Living Expenses: Not very hard  Food Insecurity: No Food Insecurity (06/30/2022)   Hunger Vital Sign    Worried About Running Out of Food in the Last Year: Never true    Ran Out of Food in the Last Year: Never true  Transportation Needs: No Transportation Needs (06/30/2022)   PRAPARE - Administrator, Civil Service (Medical): No     Lack of Transportation (Non-Medical): No  Physical Activity: Sufficiently Active (06/30/2022)   Exercise Vital Sign    Days of Exercise per Week: 7 days    Minutes of Exercise per Session: 90 min  Stress: No Stress Concern Present (06/30/2022)   [employer] of Occupational Health - Occupational Stress Questionnaire    Feeling of Stress : Not at all  Social Connections: Moderately Integrated (06/30/2022)   Social Connection and Isolation Panel [NHANES]    Frequency of Communication with Friends and Family: More than three times a week    Frequency of Social Gatherings with Friends and Family: Twice a week    Attends Religious Services: 1 to 4 times per year    Active Member of [employer] or Organizations: No    Attends Engineer, Structural: Never    Marital Status: Living with partner     Family History: The patient's family history includes Cancer in his maternal grandmother; Depression in his mother; Heart disease in his maternal grandfather and maternal grandmother; Hyperlipidemia in his maternal grandfather and maternal grandmother; Hypertension in his maternal grandfather, maternal grandmother, and mother.  ROS:   Please see the history of present illness.     All other systems reviewed and are negative.  EKGs/Labs/Other Studies Reviewed:    EKG Interpretation Date/Time:  Tuesday October 03 2023 15:28:32 EST Ventricular Rate:  87 PR Interval:  152 QRS Duration:  68 QT Interval:  382 QTC Calculation: 459 R Axis:   0  Text Interpretation: Normal sinus rhythm Confirmed by Vannie Mora ([id number]) on 10/03/2023 4:21:19 PM    Recent Labs: 05/07/2023: ALT 25; BUN 14; Creatinine, Ser 1.14; Hemoglobin 13.9; Platelets 315; Potassium 3.9; Sodium 137   Recent Lipid Panel    Component Value Date/Time   CHOL 141 01/31/2018 1047   TRIG 109 01/31/2018 1047   HDL 38 (L) 01/31/2018 1047   CHOLHDL 3.7 01/31/2018 1047   LDLCALC 81 01/31/2018 1047   LDLDIRECT 96 06/30/2022 1002     Physical Exam:   VS:  BP (!) 148/98 (BP Location: Left Arm, Patient Position: Sitting, Cuff Size: Large)   Pulse 90   Resp 16   Ht 5' 8 (1.727 m)   Wt (!) 394 lb (178.7 kg)   SpO2 96%   BMI 59.91 kg/m  , BMI Body mass index is 59.91 kg/m. GENERAL:  Well appearing, overweight HEENT: Pupils equal round and reactive, fundi not visualized, oral mucosa unremarkable NECK:  No jugular venous distention, waveform within normal limits, carotid upstroke brisk and symmetric, no bruits, no thyromegaly LYMPHATICS:  No cervical adenopathy LUNGS:  Clear to auscultation bilaterally HEART:  RRR.  PMI not displaced or sustained,S1 and S2 within normal limits, no S3, no S4, no clicks, no rubs, no murmurs ABD:  Flat, positive bowel sounds normal in frequency in pitch, no bruits, no rebound, no  guarding, no midline pulsatile mass, no hepatomegaly, no splenomegaly EXT:  2 plus pulses throughout, no edema, no cyanosis no clubbing SKIN:  No rashes no nodules, NEURO:  Cranial nerves II through XII grossly intact, motor grossly intact throughout PSYCH:  Cognitively intact, oriented to person place and time   ASSESSMENT/PLAN:    HTN -greater than 10-year history of high blood pressure that has not been well controlled. Has been out of medications for >6 mos.  Resume amlodipine -olmesartan  5-40 mg daily.  Lab work in 1-2 weeks CMP, renin aldosterone Heart healthy diet and regular cardiovascular exercise encouraged.   If BP remains difficult to control despite medication changes consider renal artery duplex at follow-up.  Dyspnea -ED visit 04/30/2022 with dyspnea and mildly elevated BNP.  Echocardiogram to rule out heart failure and assess for hypertensive heart disease.  OSA - Compliant with BIPAP.  Continued compliance encouraged. Will refer to neurology to reestablish BIPAP management.   Obesity - Weight loss via diet and exercise encouraged. Discussed the impact being overweight would have on  cardiovascular risk. No formal exercise routine, plans to start routine with coworkers. A1c with labs in 1-2 weeks.   Screening for Secondary Hypertension:     06/30/2022    1:12 PM  Causes  Drugs/Herbals Screened  Sleep Apnea Screened     - Comments on BIPAP  Thyroid  Disease Screened     - Comments 06/2022 TSH  Coarctation of the Aorta Screened     - Comments 06/2022 carotid duplex  Compliance Screened    Relevant Labs/Studies:    Latest Ref Rng & Units 05/07/2023    8:03 AM 06/30/2022   10:02 AM 04/30/2022   10:03 AM  Basic Labs  Sodium 135 - 145 mmol/L 137  139  141   Potassium 3.5 - 5.1 mmol/L 3.9  4.5  3.6   Creatinine 0.61 - 1.24 mg/dL 8.85  8.79  8.91        Latest Ref Rng & Units 06/30/2022   10:02 AM 03/13/2018   12:00 AM  Thyroid    TSH 0.450 - 4.500 uIU/mL 1.890  3.010                    Disposition:    FU with MD/PharmD/APP in 4-6 weeks   Medication Adjustments/Labs and Tests Ordered: Current medicines are reviewed at length with the patient today.  Concerns regarding medicines are outlined above.  Orders Placed This Encounter  Procedures   EKG 12-Lead   No orders of the defined types were placed in this encounter.    Signed, Reche GORMAN Finder, NP  10/03/2023 4:02 PM    [facility]

## 2023-10-24 ENCOUNTER — Encounter: Payer: Self-pay | Admitting: Neurology

## 2023-10-24 ENCOUNTER — Ambulatory Visit: Payer: Medicaid Other | Admitting: Neurology

## 2023-10-24 VITALS — BP 160/96 | HR 95 | Ht 68.0 in | Wt 398.2 lb

## 2023-10-24 DIAGNOSIS — I10 Essential (primary) hypertension: Secondary | ICD-10-CM | POA: Diagnosis not present

## 2023-10-24 DIAGNOSIS — G4726 Circadian rhythm sleep disorder, shift work type: Secondary | ICD-10-CM

## 2023-10-24 DIAGNOSIS — E661 Drug-induced obesity: Secondary | ICD-10-CM

## 2023-10-24 DIAGNOSIS — Z9989 Dependence on other enabling machines and devices: Secondary | ICD-10-CM | POA: Insufficient documentation

## 2023-10-24 DIAGNOSIS — E66813 Obesity, class 3: Secondary | ICD-10-CM | POA: Insufficient documentation

## 2023-10-24 DIAGNOSIS — E662 Morbid (severe) obesity with alveolar hypoventilation: Secondary | ICD-10-CM

## 2023-10-24 DIAGNOSIS — Z6841 Body Mass Index (BMI) 40.0 and over, adult: Secondary | ICD-10-CM | POA: Diagnosis not present

## 2023-10-24 DIAGNOSIS — G4733 Obstructive sleep apnea (adult) (pediatric): Secondary | ICD-10-CM | POA: Diagnosis not present

## 2023-10-24 NOTE — Addendum Note (Signed)
Addended by: Melvyn Novas on: 10/24/2023 02:05 PM   Modules accepted: Orders
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
## 2023-10-24 NOTE — Patient Instructions (Signed)
Expand All Collapse All      SLEEP MEDICINE CLINIC     Provider:  Melvyn Novas, MD  Primary Care Physician:  Patient, No Pcp Per No address on file      Referring Provider: Alver Sorrow, Np [address] [place],  [place] [address]             Chief Complaint according to patient   Patient presents with:      New Patient (Initial Visit)             HISTORY OF PRESENT ILLNESS:  Jacob Lloyd is a 38 y.o. male patient who is seen upon referral on 10/24/2023 from Dr Eldridge Dace Gillian Shields , NP .  for a new evaluation of  severe OSA, BiPAP / CPAP.  SOB and obesity, morbid class 3. No DM. No CAD.  Chief concern according to patient :  I am on my third PAP machine and cardiology is concerned about my hypertension.  Here today 160/ 99 mmHg , BMI 60.5 .     I have the pleasure of seeing Jacob Lloyd 10/24/23 a right-handed male with a known sleep disorder, here to reconnect . Jacob Lloyd.  had a sleep study completed in 07/2014 in [place], [place]. He got his BIPAP machine in 08/2014.  The pt states that his current machine is noisy and he doesnt feel the pressure is like it used to be.  He moved to [place]  about 2 years ago and has not been established with a sleep doc of DME.  His girlfriend witnessed him Snoring while using the machine. He hasn't had supplies in over a year. He had been given an oxygen tank over night, but travels for business and it was too much of a hassle.  He was retested here in 2019 and has nor received any PAP machine through [facility] before. This new BiPAP broke in 2021 and her purchased a new one privately,  here to make sure his OSA is optimally treated.  He is BIPAP depndent and highly compliant.    The patient had the first sleep study in the year 2015  .Jacob Lloyd was first evaluated for OSA in high School, and received a CPAP which he used for 2 years. He stopped using it due to discomfort- high pressure. His apnea was severe,  asccoaited with complex apnea, , hypoxemia, hypercapnia- obesity hypoventilation.   He uses BiPAP now since 2015 with an ST function at 18 breath per minute, 24-20 cm water pressure.  He is a Curator . Altogether he has been aware of having sleep apnea well since his teenage.  His body mass index was 57.2 and has been high even during childhood. He has gained weight since he moved to [place], and began working night shifts in 2016.  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     Sleep relevant medical history: Nocturia once at night-, Tonsillectomy age 13, , no interval history. I   Expand All Collapse All  SLEEP MEDICINE CLINIC       HPI:  Physician assistant Deliah Boston also mentioned that the patient has sometimes ear pressure, ear pain which may have been related to sinusitis or allergies, his blood pressure was 146/100 at his primary care visit.   I was also provided with a  polysomnography report of a split-night study performed on 12 August 2014 by Dr. Bertell Maria at [facility] [place] in [place], [place]. The patient presented with a decreased proportion of REM sleep at only 12%, there were 136 obstructive apneas, 63 mixed apneas 88 central apneas altogether this was a complex apnea situation with REM AHI reaching 116.5 apneas per hour.  Overall AHI was 141.3/h there was no associated periodic limb movements noted no arousals out of REM sleep, the baseline oxygen saturation during sleep was 81% the nadir 50% and the patient's saturation was below 89% SPO2 for a total of 118.5 minutes.  Dr. Azzie Almas also mentioned severe snoring.  The patient was appropriately split BiPAP was only initiated after the patient could not tolerate CPAP at 20 cmH2O it also created central apneas.  Bilevel  titration to 16/10 centimeter did not reveal improvement of sleep disordered breathing with persistent severe arterial hypoxemia, and responses by heart rate with PAC PVCs and tachybradycardia.  BiPAP titration was then ordered with a back-up rate and the patient returned for this at a later time.   I do not have the report of the BiPAP full night titration available but I do have a download, his compliance report states that he uses a machine 100% of the last 30 days with 90% of the time only 3 days over to less than 10% but under 4 hours of use a time.  Average use of time in daytime for this nocturnal worker is 6 hours and 5 minutes he is using a 8 curve ST machine inspiratory pressure 25 cm water expiratory pressure 20 cmH2O respiratory rate of 18 ST.  The residual AHI is excellent at only 0.2 and it looks like he has rather minor air leaks.  Given that the patient has not had new equipment that looks like the machine is still working at the settings that are close to ideal. I would like to mention that I have the visit notes from 31 July 2014 prior to the patient being prescribed CPAP and finally BiPAP and based on these data I should be able to just order new equipment instead of having to repeat a sleep study.  I would also like to add that the patient can be referred to a medical weight management program or bariatric program if he chooses.           2025;  Family medical /sleep history: NO  other family member on CPAP with OSA, no insomnia, no sleep walkers. His  dad died young  at age 26 in a gun violence act when the patient was 5.    Social history:  Patient is working as Network engineer  and lives in a household with fiance-   No pets, Family status is committed relationship , with 1 child, now 15 months. He has a 56 year old daughter tha lives with her mother, a previous relation.  The patient currently works  in shifts( night/ rotating,) Tobacco use; none .  ETOH use - seldomly ,   Caffeine intake in form of Coffee( /) Soda( /) Tea ( /) or energy drinks-  Exercise in form of  walking at work, he  is walking instead of driving a cart.        Sleep habits are as follows: The patient's dinner time is between 4-5 PM.  Another snack at  work between 2-3 AM, The patient goes to bed at 10 AM and continues to sleep for 6  hours, no wakes for bathroom breaks,  he reports sweating more while sleeping  , on face and neck, chest.  The preferred sleep position is supine, 1 , with the support of 1 pillows. No GERD.  Dreams are reportedly frequent.   The patient wakes up spontaneously  just before the alarm. 4 PM is the usual rise time. He  reports not feeling refreshed or restored in AM, with symptoms such as dry mouth, no morning headaches, but some pulsatile tinnitus,  no lightheadedness, , and residual fatigue.  Naps are taken infrequently, he has little opportunity- but can fall asleep watching TV, reading-  lasting from 5 to 20 minutes and are refreshing sleep.        BiPAP data :  The last encounter with Jacob Lloyd was on 04-19-2018 for a biphasic airway pressure titration he had reported that he fell asleep driving before he was first evaluated and diagnosed with obstructive sleep apnea at age 73 and he received first a CPAP which she could use for about 2 years there was a lot of discomfort however and he for a while not used it then he was rediagnosed again this is severe apnea at this time described as complex and associated with hypoxemia and hypercapnia.  He was switched to BiPAP in 2015 with an ST function at 18 breaths/min and a setting of 24 over 20 cm water pressure and at the time he was already a night shift worker.  His BMI was 57 at the time.     And he had reported that his sleepiness had partially recurred and he had wondered at the time if he was still well treated.  Today it shows that he is 100% compliant with his BiPAP machine 25 over 21 cm setting is 14  bpm respiratory capture.  And his residual AHI is 1.6/h there also are not very high air leaks the 95th percentile air leak is 20 cm water and it seems that his apnea is very well-controlled.     So I have not a good explanation of why he is sleepy here again but there has been some additional weight gain as his BMI now is over 60.  I doubt that we find a more comfortable setting on BiPAP but we may find a mask that provides better coverage and less air leak.  Given the reduced apnea-hypopnea index I doubt that residual apnea is still a factor in his hypertension condition.   Review of Systems: Out of a complete 14 system review, the patient complains of only the following symptoms, and all other reviewed systems are negative.:  Fatigue, sleepiness , snoring, fragmented sleep, night shift work,  diaphoresis, snoring through the mask.    How likely are you to doze in the following situations: 0 = not likely, 1 = slight chance, 2 = moderate chance, 3 = high chance   Sitting and Reading? Watching Television? Sitting inactive in a public place (theater or meeting)? As a passenger in a car for an hour without a break? Lying down in the afternoon when circumstances permit? Sitting and talking to someone? Sitting quietly after lunch without alcohol? In a car, while stopped for a few minutes in traffic?   Total = 6/ 24 points   FSS endorsed at 29/ 63 points.    Social History         Socioeconomic History   Marital status: Single      Spouse name: Not on file  Number of children: 2   Years of education: Not on file   Highest education level: Not on file  Occupational History   Occupation: Shipping and Receiving   Tobacco Use   Smoking status: Former      Current packs/day: 0.00      Types: Cigarettes      Quit date: 2016      Years since quitting: 9.0   Smokeless tobacco: Never  Vaping Use   Vaping status: Never Used  Substance and Sexual Activity   Alcohol use: Yes       Comment: drinks beer socially   Drug use: Yes      Types: Marijuana      Comment: once per day   Sexual activity: Yes      Birth control/protection: None  Other Topics Concern   Not on file  Social History Narrative    Right handed     Wears contacts     Social Drivers of Health        Financial Resource Strain: Low Risk  (06/30/2022)    Overall Financial Resource Strain (CARDIA)     Difficulty of Paying Living Expenses: Not very hard  Food Insecurity: No Food Insecurity (06/30/2022)    Hunger Vital Sign     Worried About Running Out of Food in the Last Year: Never true     Ran Out of Food in the Last Year: Never true  Transportation Needs: No Transportation Needs (06/30/2022)    PRAPARE - Therapist, art (Medical): No     Lack of Transportation (Non-Medical): No  Physical Activity: Sufficiently Active (06/30/2022)    Exercise Vital Sign     Days of Exercise per Week: 7 days     Minutes of Exercise per Session: 90 min  Stress: No Stress Concern Present (06/30/2022)    [employer] of Occupational Health - Occupational Stress Questionnaire     Feeling of Stress : Not at all  Social Connections: Moderately Integrated (06/30/2022)    Social Connection and Isolation Panel [NHANES]     Frequency of Communication with Friends and Family: More than three times a week     Frequency of Social Gatherings with Friends and Family: Twice a week     Attends Religious Services: 1 to 4 times per year     Active Member of [employer] or Organizations: No     Attends Engineer, structural: Never     Marital Status: Living with partner           Family History  Problem Relation Age of Onset   Hypertension Mother     Depression Mother     Cancer Maternal Grandmother     Heart disease Maternal Grandmother     Hyperlipidemia Maternal Grandmother     Hypertension Maternal Grandmother     Hypertension Maternal Grandfather     Hyperlipidemia Maternal Grandfather      Heart disease Maternal Grandfather                Past Medical History:  Diagnosis Date   Hypertension     Sleep apnea                 Past Surgical History:  Procedure Laterality Date   EYE SURGERY       FRACTURE SURGERY Left      ankle surgery   TONSILLECTOMY   2005  Medications Ordered Prior to Encounter        Current Outpatient Medications on File Prior to Visit  Medication Sig Dispense Refill   amLODipine-olmesartan (AZOR) 5-40 MG tablet Take 1 tablet by mouth daily. 90 tablet 1   ondansetron (ZOFRAN-ODT) 8 MG disintegrating tablet Take 1 tablet (8 mg total) by mouth every 8 (eight) hours as needed for nausea or vomiting. 20 tablet 0    No current facility-administered medications on file prior to visit.        Allergies  No Known Allergies      DIAGNOSTIC DATA (LABS, IMAGING, TESTING) - I reviewed patient records, labs, notes, testing and imaging myself where available.   Recent Labs       Lab Results  Component Value Date    WBC 10.6 (H) 05/07/2023    HGB 13.9 05/07/2023    HCT 43.9 05/07/2023    MCV 94.2 05/07/2023    PLT 315 05/07/2023      Labs (Brief)          Component Value Date/Time    NA 137 05/07/2023 0803    NA 139 06/30/2022 1002    K 3.9 05/07/2023 0803    CL 101 05/07/2023 0803    CO2 27 05/07/2023 0803    GLUCOSE 140 (H) 05/07/2023 0803    BUN 14 05/07/2023 0803    BUN 15 06/30/2022 1002    CREATININE 1.14 05/07/2023 0803    CALCIUM 9.9 05/07/2023 0803    PROT 8.3 (H) 05/07/2023 0803    PROT 7.5 06/30/2022 1002    ALBUMIN 4.2 05/07/2023 0803    ALBUMIN 4.6 06/30/2022 1002    AST 19 05/07/2023 0803    ALT 25 05/07/2023 0803    ALKPHOS 58 05/07/2023 0803    BILITOT 0.8 05/07/2023 0803    BILITOT 0.6 06/30/2022 1002    GFRNONAA >60 05/07/2023 0803    GFRAA 105 03/14/2018 0848      Recent Labs       Lab Results  Component Value Date    CHOL 141 01/31/2018    HDL 38 (L) 01/31/2018    LDLCALC 81  01/31/2018    LDLDIRECT 96 06/30/2022    TRIG 109 01/31/2018    CHOLHDL 3.7 01/31/2018      Recent Labs       Lab Results  Component Value Date    HGBA1C 5.0 06/30/2022      Recent Labs       Lab Results  Component Value Date    VITAMINB12 276 03/13/2018      Recent Labs       Lab Results  Component Value Date    TSH 1.890 06/30/2022        PHYSICAL EXAM:       Today's Vitals    10/24/23 1251 10/24/23 1257  BP: (!) 167/110 (!) 160/96  Pulse: 95    Weight: (!) 398 lb 3.2 oz (180.6 kg)    Height: 5\' 8"  (1.727 m)      Body mass index is 60.55 kg/m.       Wt Readings from Last 3 Encounters:  10/24/23 (!) 398 lb 3.2 oz (180.6 kg)  10/03/23 (!) 394 lb (178.7 kg)  07/17/23 (!) 400 lb (181.4 kg)         Ht Readings from Last 3 Encounters:  10/24/23 5\' 8"  (1.727 m)  10/03/23 5\' 8"  (1.727 m)  07/17/23 5\' 8"  (1.727 m)      General:  The patient is awake, alert and appears not in acute distress. The patient is well groomed. Head: Normocephalic, atraumatic. The patient is awake, alert and appears not in acute distress. The patient is well groomed. Head: Normocephalic, atraumatic. Neck is supple. Mallampati 5,  neck circumference:23. Nasal airflow patent,  Retrognathia is not seen.  Cardiovascular:  Regular rate and rhythm , without  murmurs or carotid bruit, and without distended neck veins. Respiratory: Lungs are clear to auscultation. Skin:  Without evidence of edema, or rash Trunk: BMI is 57. The patient's posture is erect   Neurologic exam : The patient is awake and alert, oriented to place and time.   Memory subjective described as intact. Attention span & concentration ability appears normal.  Speech is fluent,  without  dysarthria, dysphonia or aphasia.  Mood and affect are appropriate.   Cranial nerves: Intact taste and smell. Had Covid  in 2021, recovered fully.     Pupils are equal and briskly reactive to light. Funduscopic exam without evidence of  pallor or edema.  Extraocular movements  in vertical and horizontal planes intact and without nystagmus. Visual fields by finger perimetry are intact. Hearing to finger rub intact.  Facial sensation intact to fine touch.  Facial motor strength is symmetric and tongue and uvula move midline. Shoulder shrug was symmetrical.    Motor exam: Normal tone, muscle bulk and symmetric strength in all extremities.   Sensory:  Fine touch, was normal. Coordination: Rapid alternating movements normal. Finger-to-nose maneuver  normal without evidence of ataxia, dysmetria or tremor. Gait and station: Patient walks without assistive device and is able unassisted to climb up to the exam table. Strength within normal limits.  Stance is stable and wide based due to obesity . Toe and heel stand were deferred- status post ankle injury . Turns with  3 Steps.  Deep tendon reflexes: in the  upper and lower extremities are symmetrically attenuated  and intact.      ASSESSMENT AND PLAN 38 y.o. year old male BIPAP dependent OSA patient  here with:   Shift work sleep disorder, getting 6 hours sleep in 24 hours.    Snoring through the mask   More often feeling the need to nap, checking on causes for persistent hypersomnia.    Risk factors for OSA, OHV are unchanged. He reports he was once on a oxygen for OHV, hypoxemia. - while on CPAP> 2013-2015.    Plan :      1)  the patient reports this machine is about 38 years old, may be three,  he didn't buy it through insurance.  The machine may be older than the set up date suggests. BIPAP set currently   25 over 21 cm water , AHI is 1.6/h and  ST is 14 respirations a minute .     2) retest for baseline ?  OHV and OSA will both be still present, and I would hate to impose a night without PAP therapy  on him and he is BiPAP dependent.  I instead discussed a re-titration with him, and we will see if oxygen is needed. He has had aerophagia in the remote past , years ago.  There is no evidence of central apnea arising.      3)  FFM airfit F 20 medium- I offered a memory foam liner to improve air leak.  I also provided a fresh headgear and  measured the size.      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 200 mg  prn use.      Follow up after BiPAP re-titration, possible ST function resetting or oxygen being added. - can be with MD or NP

## 2023-10-30 ENCOUNTER — Telehealth: Payer: Self-pay | Admitting: Neurology

## 2023-10-30 NOTE — Telephone Encounter (Signed)
Bipap MCD Healthy blue pending.

## 2023-11-01 NOTE — Telephone Encounter (Signed)
 Split MCD Healhty blue Siegfried Dress: [id number] (exp. 10/30/23 to 12/28/23)

## 2023-11-02 NOTE — Telephone Encounter (Signed)
 LVM for pt to call back to schedule.

## 2023-11-02 NOTE — Telephone Encounter (Signed)
 Patient returned my call.  Bipap MCD Healthy blue Siegfried Dress: [id number] (exp. 10/30/23 to 12/28/23)   He is scheduled at [facility] For 11/28/23 at 8 pm.  Mailed packet to the patient.

## 2023-11-08 ENCOUNTER — Ambulatory Visit (INDEPENDENT_AMBULATORY_CARE_PROVIDER_SITE_OTHER): Payer: Medicaid Other | Admitting: Cardiovascular Disease

## 2023-11-08 ENCOUNTER — Encounter (HOSPITAL_BASED_OUTPATIENT_CLINIC_OR_DEPARTMENT_OTHER): Payer: Self-pay | Admitting: Cardiovascular Disease

## 2023-11-08 VITALS — BP 137/91 | HR 98 | Ht 68.0 in | Wt 388.8 lb

## 2023-11-08 DIAGNOSIS — G4730 Sleep apnea, unspecified: Secondary | ICD-10-CM

## 2023-11-08 DIAGNOSIS — G4733 Obstructive sleep apnea (adult) (pediatric): Secondary | ICD-10-CM | POA: Diagnosis not present

## 2023-11-08 DIAGNOSIS — M109 Gout, unspecified: Secondary | ICD-10-CM

## 2023-11-08 DIAGNOSIS — I10 Essential (primary) hypertension: Secondary | ICD-10-CM | POA: Diagnosis not present

## 2023-11-08 DIAGNOSIS — M10471 Other secondary gout, right ankle and foot: Secondary | ICD-10-CM | POA: Diagnosis not present

## 2023-11-08 DIAGNOSIS — Z5181 Encounter for therapeutic drug level monitoring: Secondary | ICD-10-CM

## 2023-11-08 HISTORY — DX: Gout, unspecified: M10.9

## 2023-11-08 MED ORDER — COLCHICINE 0.6 MG PO TABS: ORAL_TABLET | ORAL | 0 refills | Status: DC

## 2023-11-08 MED ORDER — ALLOPURINOL 100 MG PO TABS: ORAL_TABLET | ORAL | 5 refills | Status: DC

## 2023-11-08 NOTE — Progress Notes (Signed)
[facility] Initial Assessment:    Date:  11/08/2023   ID:  Jacob Lloyd, DOB 1986-02-24, MRN [id number]  PCP:  Patient, No Pcp Per  Cardiologist:  None  Nephrologist:  Referring MD: No ref. provider found   CC: Hypertension  History of Present Illness:    Jacob Lloyd is a 38 y.o. male with a hx of hypertension, sleep apnea , and morbid obesity here for follow-up.  He first establish care in the [facility] 06/2022.  He notes he was previously seen by cardiology in [place] in 2015.  It is unclear what that evaluation was for.  He was diagnosed with hypertension in his 84s and it has been difficult to control.  He uses his BiPAP regularly and had an echo 06/2023 that revealed LVEF 55 to 55% and normal RV function.  When he followed up in 09/2023 blood pressure was uncontrolled but he had been out of amlodipine/olmesartan for over 6 months.  This was resumed.  He reported dyspnea and an echo was ordered to rule out heart failure but has not been performed.    [name] experiences recurrent episodes of gout, with the current flare beginning last Friday. The pain primarily affects his ankle, though it has also involved his big toe in the past. The episodes have increased in frequency over the past six months, occurring three to four times, compared to once or twice a year previously. He recalls a past episode approximately seven to eight years ago when his uric acid levels were elevated, and he was treated with a colchicine sample and an injection in his ankle. He manages the pain with Advil, taking it as needed depending on the severity of the flare. He has not been diagnosed with gout by a healthcare provider but suspects it based on family history and symptoms. No redness or swelling in his ankle or hip outside of the gout episodes.  Regarding blood pressure management, he admits to inconsistent medication use due to a period  without access to his medication. However, he has resumed taking it regularly over the past week, missing only one dose on Sunday. He has not been monitoring his blood pressure at home recently but has the means to do so. No significant changes in his breathing, although he notes some shortness of breath with exertion, which he attributes to a loss of physical conditioning. He denies any swelling outside of the gout episodes.  In terms of dietary habits, he has reduced his intake of rice and pasta, focusing more on meat and vegetables, which has contributed to a weight loss of ten pounds since his last visit. He acknowledges consuming red meat regularly and had shrimp recently, which he believes may trigger his symptoms. He has stopped drinking alcohol, suspecting it as a trigger for his gout flares.     Previous antihypertensives:  Past Medical History:  Diagnosis Date   Gout 11/08/2023   Hypertension    Sleep apnea     Past Surgical History:  Procedure Laterality Date   EYE SURGERY     FRACTURE SURGERY Left    ankle surgery   TONSILLECTOMY  2005    Current Medications: Current Meds  Medication Sig   allopurinol (ZYLOPRIM) 100 MG tablet TAKE 1 TABLET DAILY FOR 1 WEEK THEN INCREASE TO 2 TABLETS DAILY   amLODipine-olmesartan (AZOR) 5-40 MG tablet Take 1 tablet by mouth daily.   colchicine 0.6 MG tablet TAKE 2 TABLETS AND THEN 1  HOUR LATE TAKE 1 TABLET FOR TOTAL OF 3 TABLETS   ibuprofen (ADVIL) 200 MG tablet Take 400 mg by mouth as needed for mild pain (pain score 1-3).   [DISCONTINUED] ondansetron (ZOFRAN-ODT) 8 MG disintegrating tablet Take 1 tablet (8 mg total) by mouth every 8 (eight) hours as needed for nausea or vomiting.     Allergies:   Patient has no known allergies.   Social History   Socioeconomic History   Marital status: Single    Spouse name: Not on file   Number of children: 2   Years of education: Not on file   Highest education level: Not on file  Occupational  History   Occupation: Shipping and Receiving   Tobacco Use   Smoking status: Former    Current packs/day: 0.00    Types: Cigarettes    Quit date: 2016    Years since quitting: 9.1   Smokeless tobacco: Never  Vaping Use   Vaping status: Never Used  Substance and Sexual Activity   Alcohol use: Yes    Comment: drinks beer socially   Drug use: Yes    Types: Marijuana    Comment: once per day   Sexual activity: Yes    Birth control/protection: None  Other Topics Concern   Not on file  Social History Narrative   Right handed    Wears contacts    Social Drivers of Health   Financial Resource Strain: Low Risk  (06/30/2022)   Overall Financial Resource Strain (CARDIA)    Difficulty of Paying Living Expenses: Not very hard  Food Insecurity: No Food Insecurity (06/30/2022)   Hunger Vital Sign    Worried About Running Out of Food in the Last Year: Never true    Ran Out of Food in the Last Year: Never true  Transportation Needs: No Transportation Needs (06/30/2022)   PRAPARE - Administrator, Civil Service (Medical): No    Lack of Transportation (Non-Medical): No  Physical Activity: Sufficiently Active (06/30/2022)   Exercise Vital Sign    Days of Exercise per Week: 7 days    Minutes of Exercise per Session: 90 min  Stress: No Stress Concern Present (06/30/2022)   [employer] of Occupational Health - Occupational Stress Questionnaire    Feeling of Stress : Not at all  Social Connections: Moderately Integrated (06/30/2022)   Social Connection and Isolation Panel [NHANES]    Frequency of Communication with Friends and Family: More than three times a week    Frequency of Social Gatherings with Friends and Family: Twice a week    Attends Religious Services: 1 to 4 times per year    Active Member of [employer] or Organizations: No    Attends Engineer, structural: Never    Marital Status: Living with partner     Family History: The patient's family history includes  Cancer in his maternal grandmother; Depression in his mother; Heart disease in his maternal grandfather and maternal grandmother; Hyperlipidemia in his maternal grandfather and maternal grandmother; Hypertension in his maternal grandfather, maternal grandmother, and mother.  ROS:   Please see the history of present illness.    All other systems reviewed and are negative.  EKGs/Labs/Other Studies Reviewed:    EKG:  EKG is not ordered today.   Recent Labs: 05/07/2023: ALT 25; BUN 14; Creatinine, Ser 1.14; Hemoglobin 13.9; Platelets 315; Potassium 3.9; Sodium 137   Recent Lipid Panel    Component Value Date/Time   CHOL 141 01/31/2018 1047  TRIG 109 01/31/2018 1047   HDL 38 (L) 01/31/2018 1047   CHOLHDL 3.7 01/31/2018 1047   LDLCALC 81 01/31/2018 1047   LDLDIRECT 96 06/30/2022 1002    Physical Exam:   VS:  BP (!) 137/91 (BP Location: Left Arm, Patient Position: Sitting, Cuff Size: Large)   Pulse 98   Ht 5\' 8"  (1.727 m)   Wt (!) 388 lb 12.8 oz (176.4 kg)   SpO2 98%   BMI 59.12 kg/m  , BMI Body mass index is 59.12 kg/m. GENERAL:  Well appearing HEENT: Pupils equal round and reactive, fundi not visualized, oral mucosa unremarkable NECK:  No jugular venous distention, waveform within normal limits, carotid upstroke brisk and symmetric, no bruits, no thyromegaly LUNGS:  Clear to auscultation bilaterally HEART:  RRR.  PMI not displaced or sustained,S1 and S2 within normal limits, no S3, no S4, no clicks, no rubs, no murmurs ABD:  Flat, positive bowel sounds normal in frequency in pitch, no bruits, no rebound, no guarding, no midline pulsatile mass, no hepatomegaly, no splenomegaly EXT:  2 plus pulses throughout, no edema, no cyanosis no clubbing SKIN:  No rashes no nodules NEURO:  Cranial nerves II through XII grossly intact, motor grossly intact throughout PSYCH:  Cognitively intact, oriented to person place and time   ASSESSMENT/PLAN:    # Hypertension Patient has been  inconsistent with medication use due to lack of supply, but has resumed regular use in the past week. -Continue current medication regimen. -Advise patient to monitor blood pressure at home twice daily for the next 1-2 weeks. -If blood pressure readings consistently exceed 130/80, consider increasing dose of current medication.   # Gout Recurrent episodes of gout, currently experiencing a flare in the ankle. History of elevated uric acid. Self-treating with Advil. -Order uric acid level today. -Prescribe colchicine 1.2mg  now and 0.6mg  one hour later for acute flare. -Start allopurinol 100mg  daily for a week, then increase to 200mg  daily after the flare resolves.  # Weight loss Patient has lost 10 pounds in the past month through dietary changes and some exercise, though exercise has been limited due to gout flare. -Encourage continuation of dietary changes and increased exercise once gout flare resolves.  # General Health Maintenance -Order lipid panel and comprehensive metabolic panel today. -Schedule follow-up appointment in 6 months to monitor blood pressure control.     Screening for Secondary Hypertension:     06/30/2022    1:12 PM  Causes  Drugs/Herbals Screened  Sleep Apnea Screened     - Comments on BIPAP  Thyroid Disease Screened     - Comments 06/2022 TSH  Coarctation of the Aorta Screened     - Comments 06/2022 carotid duplex  Compliance Screened    Relevant Labs/Studies:    Latest Ref Rng & Units 05/07/2023    8:03 AM 06/30/2022   10:02 AM 04/30/2022   10:03 AM  Basic Labs  Sodium 135 - 145 mmol/L 137  139  141   Potassium 3.5 - 5.1 mmol/L 3.9  4.5  3.6   Creatinine 0.61 - 1.24 mg/dL 9.60  4.54  0.98        Latest Ref Rng & Units 06/30/2022   10:02 AM 03/13/2018   12:00 AM  Thyroid   TSH 0.450 - 4.500 uIU/mL 1.890  3.010       Disposition:    FU with MD/PharmD in 6 months    Medication Adjustments/Labs and Tests Ordered: Current medicines are reviewed  at length  with the patient today.  Concerns regarding medicines are outlined above.  Orders Placed This Encounter  Procedures   Lipid panel   Comprehensive metabolic panel   Uric acid   Meds ordered this encounter  Medications   colchicine 0.6 MG tablet    Sig: TAKE 2 TABLETS AND THEN 1 HOUR LATE TAKE 1 TABLET FOR TOTAL OF 3 TABLETS    Dispense:  3 tablet    Refill:  0   allopurinol (ZYLOPRIM) 100 MG tablet    Sig: TAKE 1 TABLET DAILY FOR 1 WEEK THEN INCREASE TO 2 TABLETS DAILY    Dispense:  60 tablet    Refill:  5   Signed, Chilton Si, MD  11/08/2023 5:14 PM    [facility]

## 2023-11-08 NOTE — Patient Instructions (Addendum)
Medication Instructions:  TAKE COLCHICINE 0.6 MG 2 TABLETS AND THEN 1 TABLET 1 HOUR LATER FOR TOTAL OF 3 TABLETS  START ALLOPURINOL 100 MG DAILY FOR 1 WEEK AND THEN INCREASE TO 2 TABLETS DAILY  ADDITIONAL REFILLS WILL NEED TO COME FROM YOUR PRIMARY CARE   Labwork: LP/CMET/URIC ACID TODAY   Testing/Procedures: NONE  Follow-Up: 6 MONTHS WITH CAITLIN W NP OR DR [place]   Any Other Special Instructions Will Be Listed Below (If Applicable). FIND A PRIMARY CARE SOON TO FOLLOW UP   If you need a refill on your cardiac medications before your next appointment, please call your pharmacy.

## 2023-11-09 LAB — COMPREHENSIVE METABOLIC PANEL
ALT: 24 [IU]/L (ref 0–44)
AST: 24 [IU]/L (ref 0–40)
Albumin: 4.4 g/dL (ref 4.1–5.1)
Alkaline Phosphatase: 77 [IU]/L (ref 44–121)
BUN/Creatinine Ratio: 13 (ref 9–20)
BUN: 15 mg/dL (ref 6–20)
Bilirubin Total: 0.4 mg/dL (ref 0.0–1.2)
CO2: 23 mmol/L (ref 20–29)
Calcium: 9.8 mg/dL (ref 8.7–10.2)
Chloride: 102 mmol/L (ref 96–106)
Creatinine, Ser: 1.15 mg/dL (ref 0.76–1.27)
Globulin, Total: 3.3 g/dL (ref 1.5–4.5)
Glucose: 76 mg/dL (ref 70–99)
Potassium: 4.7 mmol/L (ref 3.5–5.2)
Sodium: 141 mmol/L (ref 134–144)
Total Protein: 7.7 g/dL (ref 6.0–8.5)
eGFR: 84 mL/min/{1.73_m2} (ref >59)

## 2023-11-09 LAB — LIPID PANEL
Chol/HDL Ratio: 4 {ratio} (ref 0.0–5.0)
Cholesterol, Total: 191 mg/dL (ref 100–199)
HDL: 48 mg/dL (ref >39)
LDL Chol Calc (NIH): 116 mg/dL — ABNORMAL HIGH (ref 0–99)
Triglycerides: 152 mg/dL — ABNORMAL HIGH (ref 0–149)
VLDL Cholesterol Cal: 27 mg/dL (ref 5–40)

## 2023-11-09 LAB — URIC ACID: Uric Acid: 9.4 mg/dL — ABNORMAL HIGH (ref 3.8–8.4)

## 2023-11-28 ENCOUNTER — Ambulatory Visit: Payer: Medicaid Other | Admitting: Neurology

## 2023-11-28 DIAGNOSIS — G4733 Obstructive sleep apnea (adult) (pediatric): Secondary | ICD-10-CM | POA: Diagnosis not present

## 2023-11-28 DIAGNOSIS — Z9989 Dependence on other enabling machines and devices: Secondary | ICD-10-CM

## 2023-11-28 DIAGNOSIS — G4726 Circadian rhythm sleep disorder, shift work type: Secondary | ICD-10-CM

## 2023-11-28 DIAGNOSIS — E662 Morbid (severe) obesity with alveolar hypoventilation: Secondary | ICD-10-CM

## 2023-11-28 DIAGNOSIS — I10 Essential (primary) hypertension: Secondary | ICD-10-CM

## 2023-11-28 DIAGNOSIS — E661 Drug-induced obesity: Secondary | ICD-10-CM

## 2023-11-29 ENCOUNTER — Encounter (INDEPENDENT_AMBULATORY_CARE_PROVIDER_SITE_OTHER): Payer: Self-pay

## 2023-12-08 ENCOUNTER — Encounter: Payer: Self-pay | Admitting: Neurology

## 2023-12-08 NOTE — Procedures (Signed)
 [facility] CPAP/Bilevel Report      [facility] BiPAP TITRATION INTERPRETATION REPORT   STUDY DATE: 11/28/2023      PATIENT NAME:  Jacob Lloyd         DATE OF BIRTH:  03/10/1986  PATIENT ID:  [id number]    TYPE OF STUDY:  BiPAP  READING PHYSICIAN: Melvyn Novas, MD SCORING TECHNICIAN: Domingo Cocking, RPSGT  REFERRED BY: Gillian Shields, NP/ Dr Eldridge Dace  HISTORY: 10-24-2023:  This 38 year-old Male patient is referred by his cardiology PA/NP for a TOC/ reconnection to sleep care. He needs supplies.  He is known to have severe and complex sleep apnea and had been treated by CPAP during his High School years, but that was too uncomfortable and his needs exceeded the CPAP's highest pressures-He has been using BiPAP ST18 therapy since last PSG test in 2015 in [place],  [place].  His BiPAP ST machine broke in 2021 and he purchased another machine in private, has not been tested since 2015. The machine purchased in 2021 was probably a refurbished machine- now described as "noisy" and seems no longer to provide the needed pressure. He is a FFM user ( F20 ResMed AirFit in Large size) . His girlfriend has noted snoring break-throughs while on therapy. His HTN has increased, and his BMI is  68.  He reported that his sleepiness had partially recurred and he had wondered if he was still well treated. He is 100% compliant with his BiPAP machine at 25 over 21 cm water pressure setting  with 14 ST respiratory capture. And his residual AHI is 1.6/h there also are not very high air leaks, and it seems that his apnea is very well-controlled.   The Epworth Sleepiness Scale was endorsed at 6 out of 24 (scores above or equal to 10 are suggestive of hypersomnolence). FSS at 29/63 points.   DESCRIPTION: A sleep technologist was in attendance for the duration of the recording.  Data collection, scoring, video monitoring, and reporting were  performed in compliance with the AASM Manual for the Scoring of Sleep and Associated Events. As a physician certified by the [employer] of Sleep Medicine, I reviewed each epoch of the study.  ADDITIONAL INFORMATION:  Height: 68.0 in Weight: 398 lbs (BMI 60) Neck Size: 23.0 in  MEDICATIONS: Azor, Zofran ODT  SLEEP CONTINUITY AND SLEEP ARCHITECTURE:  Lights off was at 21:33: and lights on 05:28: (7.9 hours in bed). Total sleep time was 215.5 minutes, with a decreased sleep efficiency at 45.4%.  Sleep latency was normal at 20.5 minutes. REM sleep latency was prolonged at over 3 hours.   Of the total sleep time, the percentage of stage N1 sleep was 4.2%, stage N2 sleep was 68.9%, stage N3 sleep was 0.0%, and REM sleep was 26.9%.  There were 2 Stage R periods observed on this study night, 9 awakenings (i.e. transitions to Stage W from any sleep stage), and 28.0 total stage transitions. Wake after sleep onset (WASO) time accounted for 238 minutes.   RESPIRATORY MONITORING Based on AASM criteria (using a 3% oxygen desaturation and /or arousal rule for scoring hypopneas), there were 6 apneas (5 obstructive; 0 central; 1 mixed), and 65 hypopneas. Apnea index was 1.7. Hypopnea index was 18.1. The apnea-hypopnea index was 19.8 overall (10.1 supine, 0.0 non-supine; 6.2 REM, 6.2 supine REM). There were 0 respiratory effort-related arousals (RERAs).   The RERA index was 0.0 events/h.  OXIMETRY: Total sleep time spent  at, or below 88% was 1.0 minutes, or 0.5% of total sleep time. Respiratory events were associated with oxyhemoglobin desaturations (nadir during sleep 83%) from a mean of 95%). There were 0 occurrences of Cheyne Stokes breathing. Snoring was classified as mild with break throughs. EKG: The single electrocardiogram tracing documented NSR with PVCs.  The average heart rate during sleep was 76 bpm.  The maximum heart rate during sleep was 92 bpm and minimum heart rate was 66 bpm. BODY POSITION:  Duration of total sleep and percent of total sleep in their respective position is as follows: supine 191 minutes (88.6%), non-supine 24.5 minutes (11.4%); right 24 minutes (11.4%), left 00 minutes (0.0%), and prone 00 minutes (0.0%). Total supine REM sleep time was 58 minutes (100.0% of total REM sleep). LIMB MOVEMENTS: There were 0 periodic limb movements of sleep (0.0/h). AROUSAL: There were 32 arousals in total, for an arousal index of 8.6 arousals/hour.  Of these, 17 were identified as respiratory-related arousals (4.7 /h), 0 were PLM-related arousals (0.0 /h), and 15 were non-specific arousals (4.2 /h)    IMPRESSION:   1. BILEVEL :  FFM F 20 ( large) by ResMed was the interface the patient chose and the one he uses at home.  Sleep-disordered breathing improved at the recommended pressure of BiPAP at 21/17 with highest sleep efficiency and REM sleep rebound at an AHI of 1.48/h . A higher pressure of 22/18 cmH2O was less well tolerated. Hypoxemia was resolved with therapy.   2. Total sleep time was reduced by a prolonged period of wakefulness (238 minutes!)    3. No significant periodic limb movements (PLMs) were observed.  RECOMMENDATIONS: BILEVEL :  FFM F 20 ( large) by ResMed was the interface the patient chose and the one he uses at home.  Sleep-disordered breathing improved at the recommended pressure of BiPAP at 21/17 with highest sleep efficiency and REM sleep rebound at an AHI of 1.48/h . no ST function and no oxygen supplementation was needed.   Melvyn Novas,  MD [facility] Board certified by The [employer] of Sleep Medicine and Diplomate of the [employer] of Sleep Medicine. Board certified In Neurology through the ABPN, Fellow of the [employer] of Neurology.             [facility] CPAP/Bilevel Report    General Information  Name: [place], Mallon BMI: 60 Physician: ,    ID: [id number] Height: 68 in Technician: Domingo Cocking  Sex: Male Weight: 398 lb Record: [id number]  Age: 38 [Feb 14, 1986] Date: 11/28/2023 Scorer: Domingo Cocking   Recommended Settings IPAP: N/A cmH20 EPAP: N/A cmH2O AHI: N/A AHI (4%): N/A   Pressure IPAP/EPAP 00 18 / 14 19 / 15 20 / 16 21 / 17 22 / 18   O2 Vol 0.0 0.0 0.0 0.0 0.0 0.0  Time TRT 0.87m 53.77m 233.62m 53.23m 45.23m 89.64m   TST 0.34m 32.45m 96.48m 45.34m 40.28m 1.66m  Sleep Stage % Wake 0.0 1.5 58.6 15.9 11.0 98.9   % REM 0.0 0.0 20.2 0.0 95.1 0.0   % N1 0.0 9.2 1.6 10.0 0.0 0.0   % N2 0.0 90.8 78.2 90.0 4.9 100.0   % N3 0.0 0.0 0.0 0.0 0.0 0.0  Respiratory Total Events 0 6 15 47 1 2   Obs. Apn. 0 0 0 4 0 1   Mixed Apn. 0 0 1 0 0 0   Cen. Apn. 0 0 0 0 0  0   Hypopneas 0 6 14 43 1 1   AHI 0.00 11.08 9.33 62.67 1.48 120.00   Supine AHI 0.00 11.08 9.33 27.91 1.48 0.00   Prone AHI 0.00 0.00 0.00 0.00 0.00 0.00   Side AHI 0.00 0.00 0.00 94.47 0.00 120.00  Respiratory (4%) Hypopneas (4%) 0.00 6.00 8.00 37.00 0.00 1.00   AHI (4%) 0.00 11.08 5.60 54.67 0.00 120.00   Supine AHI (4%) 0.00 11.08 5.60 27.91 0.00 0.00   Prone AHI (4%) 0.00 0.00 0.00 0.00 0.00 0.00   Side AHI (4%) 0.00 0.00 0.00 79.15 0.00 120.00  Desat Profile <= 90% 0.42m 0.42m 5.21m 3.50m 0.63m 1.65m   <= 80% 0.91m 0.53m 4.29m 0.17m 0.51m 0.81m   <= 70% 0.64m 0.42m 4.25m 0.62m 0.7m 0.41m   <= 60% 0.59m 0.56m 4.69m 0.26m 0.8m 0.63m  Arousal Index Apnea 0.0 0.0 0.0 4.0 0.0 0.0   Hypopnea 0.0 0.0 1.9 14.7 0.0 0.0   LM 0.0 0.0 0.0 0.0 0.0 0.0   Spontaneous 0.0 0.0 3.7 6.7 4.4 60.0  [facility] CPAP Summary    General Information  Name: Elvis, Laufer BMI: 16.10 Physician: Melvyn Novas, MD  ID: [id number] Height: 68.0 in Technician: Domingo Cocking, RPSGT  Sex: Male Weight: 398.0 lb Record: [id number]  Age: 41 [[birth year]] Date: 11/28/2023     Medical & Medication History    I have the pleasure of seeing Xzayvion Vaeth 10/24/23 a  right-handed male with a known sleep disorder, here to reconnect . Mr. Judie Petit. had a sleep study completed in 07/2014 in [place], [place]. He got his BIPAP machine in 08/2014. The pt states that his current machine is noisy and he doesnt feel the pressure is like it used to be. He moved to [place], [place] about 2 years ago and has not been established with a sleep doc of DME. His girlfriend witnessed him Snoring while using the machine. He hasn't had supplies in over a year. He had been given an oxygen tank over night, but travels for business and it was too much of a hassle. He was retested here in 2019 and has nor received any PAP machine through [facility] before. This new BiPAP broke in 2021 and her purchased a new one privately, here to make sure his OSA is optimally treated. He is BIPAP depndent and highly compliant. The patient had the first sleep study in the year 2015 .Mr.Banks was first evaluated for OSA in high School, and received a CPAP which he used for 2 years. He stopped using it due to discomfort- high pressure. His apnea was severe, asccoaited with complex apnea, , hypoxemia, hypercapnia- obesity hypoventilation. He uses BiPAP now since 2015 with an ST function at 18 breath per minute, 24-20 cm water pressure. He is a Curator .  Azor, Zofran ODT   Sleep Disorder      Comments   Patient arrived for a BiPAP titration polysomnogram. Patient is currently a compliant BiPAP ST user at 25/21, BUR 14-18 with an F20 full face mask. Patient is in need of new equipment/supplies. Procedure explained and all questions answered. Patient was shown BiPAP at 15/10 cm with a large AirFit F20, a large AirFit F40 and a large Vitera full face mask. Patient said he likes the F40 FFM and is in agreement to use for tonight's titration. BiPAP was started at 15/10 cm using the large AirFit F40 full face mask. Patient slept supine and right. Mild snoring was heard  at times. Respiratory events observed. BiPAP  was increased to 22/18 cm in an effort to control respiratory events and abolish snoring. Patient had an extended arousal (WASO) from 23:41 until 03:25. Cardiac arrhythmias observed, in the form of occasional PVC's. No significant PLMS observed. One restroom visit.    CPAP start time: 09:33:42 PM CPAP end time: 05:28:12 AM   Time Total Supine Side Prone Upright  Recording (TRT) 7h 54.51m 7h 3.42m 0h 51.65m 0h 0.1m 0h 0.37m  Sleep (TST) 3h 35.58m 3h 11.40m 0h 24.26m 0h 0.44m 0h 0.20m   Latency N1 N2 N3 REM Onset Per. Slp. Eff.  Actual 0h 0.82m 0h 0.84m 0h 0.30m 6h 29.14m 0h 20.47m 0h 23.41m 45.42%   Stg Dur Wake N1 N2 N3 REM  Total 259.0 9.0 148.5 0.0 58.0  Supine 232.5 9.0 124.0 0.0 58.0  Side 26.5 0.0 24.5 0.0 0.0  Prone 0.0 0.0 0.0 0.0 0.0  Upright 0.0 0.0 0.0 0.0 0.0   Stg % Wake N1 N2 N3 REM  Total 54.6 4.2 68.9 0.0 26.9  Supine 49.0 4.2 57.5 0.0 26.9  Side 5.6 0.0 11.4 0.0 0.0  Prone 0.0 0.0 0.0 0.0 0.0  Upright 0.0 0.0 0.0 0.0 0.0     Apnea Summary Sub Supine Side Prone Upright  Total 6 Total 6 3 3  0 0    REM 0 0 0 0 0    NREM 6 3 3  0 0  Obs 5 REM 0 0 0 0 0    NREM 5 2 3  0 0  Mix 1 REM 0 0 0 0 0    NREM 1 1 0 0 0  Cen 0 REM 0 0 0 0 0    NREM 0 0 0 0 0   Rera Summary Sub Supine Side Prone Upright  Total 0 Total 0 0 0 0 0    REM 0 0 0 0 0    NREM 0 0 0 0 0   Hypopnea Summary Sub Supine Side Prone Upright  Total 65 Total 65 29 36 0 0    REM 6 6 0 0 0    NREM 59 23 36 0 0   4% Hypopnea Summary Sub Supine Side Prone Upright  Total (4%) 52 Total 52 22 30 0 0    REM 2 2 0 0 0    NREM 50 20 30 0 0     AHI Total Obs Mix Cen  19.77 Apnea 1.67 1.39 0.28 0.00   Hypopnea 18.10 -- -- --  16.15 Hypopnea (4%) 14.48 -- -- --    Total Supine Side Prone Upright  Position AHI 19.77 10.05 95.51 0.00 0.00  REM AHI 6.21   NREM AHI 24.76   Position RDI 19.77 10.05 95.51 0.00 0.00  REM RDI 6.21   NREM RDI 24.76    4% Hypopnea Total Supine Side Prone Upright  Position AHI (4%) 16.15  7.85 80.82 0.00 0.00  REM AHI (4%) 2.07   NREM AHI (4%) 21.33   Position RDI (4%) 16.15 7.85 80.82 0.00 0.00  REM RDI (4%) 2.07   NREM RDI (4%) 21.33    Desaturation Information  <100% <90% <80% <70% <60% <50% <40%  Supine 84 14 0 0 0 0 0  Side 37 13 0 0 0 0 0  Prone 0 0 0 0 0 0 0  Upright 0 0 0 0 0 0 0  Total 121 27 0 0 0 0 0  Desaturation threshold setting: 3%  Minimum desaturation setting: 10 seconds SaO2 nadir: 66% The longest event was a 30 sec obstructive Hypopnea with a minimum SaO2 of 90%. The lowest SaO2 was 83% associated with a 16 sec obstructive Apnea. EKG Rates EKG Avg Max Min  Awake 81 105 65  Asleep 76 92 66  EKG Events: PVCs

## 2023-12-08 NOTE — Addendum Note (Signed)
 Addended by: Melvyn Novas on: 12/08/2023 12:10 PM   Modules accepted: Orders

## 2024-01-02 ENCOUNTER — Encounter (HOSPITAL_BASED_OUTPATIENT_CLINIC_OR_DEPARTMENT_OTHER): Payer: Self-pay | Admitting: Cardiovascular Disease

## 2024-01-02 MED ORDER — COLCHICINE 0.6 MG PO TABS: ORAL_TABLET | ORAL | 0 refills | Status: DC

## 2024-01-02 NOTE — Telephone Encounter (Signed)
 May provide short course Colchicine for gout flare. Would recommend establish and follow up with primary care provider for further refills.   Rx: Colchicine 0.6mg . Day 1: take 2 tablets then 1 hour later take 1 tablet. Day 2-3: take 1 tablet daily. A total of 5 tablets should be sent to pharmacy.    Alver Sorrow, NP

## 2024-01-11 DIAGNOSIS — G4733 Obstructive sleep apnea (adult) (pediatric): Secondary | ICD-10-CM | POA: Diagnosis not present

## 2024-01-23 ENCOUNTER — Telehealth: Payer: Self-pay | Admitting: Neurology

## 2024-01-23 NOTE — Telephone Encounter (Signed)
 LVM and sent mychart msg asking pt to call back to schedule initial bipap appt. Pt needs to be scheduled between 03/13/24-04/11/24 with either MD or an NP

## 2024-01-25 DIAGNOSIS — G4733 Obstructive sleep apnea (adult) (pediatric): Secondary | ICD-10-CM | POA: Diagnosis not present

## 2024-02-15 DIAGNOSIS — G4733 Obstructive sleep apnea (adult) (pediatric): Secondary | ICD-10-CM | POA: Diagnosis not present

## 2024-02-22 ENCOUNTER — Telehealth: Payer: Self-pay | Admitting: Neurology

## 2024-02-22 DIAGNOSIS — G4733 Obstructive sleep apnea (adult) (pediatric): Secondary | ICD-10-CM | POA: Diagnosis not present

## 2024-02-22 NOTE — Telephone Encounter (Signed)
 Schedule patient initial CPAP with Dr. Albertina Hugger due to no availability with [facility] within compliance date. 03/13/24-04/11/24. Informed patient to bring machine and power to appt.

## 2024-02-25 DIAGNOSIS — G4733 Obstructive sleep apnea (adult) (pediatric): Secondary | ICD-10-CM | POA: Diagnosis not present

## 2024-03-22 DIAGNOSIS — G4733 Obstructive sleep apnea (adult) (pediatric): Secondary | ICD-10-CM | POA: Diagnosis not present

## 2024-03-26 DIAGNOSIS — G4733 Obstructive sleep apnea (adult) (pediatric): Secondary | ICD-10-CM | POA: Diagnosis not present

## 2024-04-03 ENCOUNTER — Ambulatory Visit: Admitting: Neurology

## 2024-04-03 ENCOUNTER — Encounter: Payer: Self-pay | Admitting: Neurology

## 2024-04-03 ENCOUNTER — Other Ambulatory Visit (HOSPITAL_BASED_OUTPATIENT_CLINIC_OR_DEPARTMENT_OTHER): Payer: Self-pay | Admitting: Family

## 2024-04-03 VITALS — BP 139/96 | HR 93 | Ht 68.0 in | Wt 388.0 lb

## 2024-04-03 DIAGNOSIS — E662 Morbid (severe) obesity with alveolar hypoventilation: Secondary | ICD-10-CM

## 2024-04-03 DIAGNOSIS — E66813 Obesity, class 3: Secondary | ICD-10-CM | POA: Diagnosis not present

## 2024-04-03 DIAGNOSIS — Z9989 Dependence on other enabling machines and devices: Secondary | ICD-10-CM

## 2024-04-03 DIAGNOSIS — Z6841 Body Mass Index (BMI) 40.0 and over, adult: Secondary | ICD-10-CM

## 2024-04-03 DIAGNOSIS — E661 Drug-induced obesity: Secondary | ICD-10-CM

## 2024-04-03 DIAGNOSIS — G4726 Circadian rhythm sleep disorder, shift work type: Secondary | ICD-10-CM | POA: Diagnosis not present

## 2024-04-03 MED ORDER — ZALEPLON 5 MG PO CAPS: 5.0000 mg | ORAL_CAPSULE | Freq: Every evening | ORAL | 0 refills | Status: DC | PRN

## 2024-04-03 NOTE — Progress Notes (Signed)
 Jacob Lloyd

## 2024-04-03 NOTE — Patient Instructions (Signed)
 I have referred you to a weight loss specialist.   You will follow up with  [facility] ( our office ) once a year.  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 Sonata  Zaleplon  Capsules What is this medication? ZALEPLON  (ZAL e plon) treats insomnia. It helps you go to sleep faster. It is often used for a short time. This medicine may be used for other purposes; ask your health care provider or pharmacist if you have questions. COMMON BRAND NAME(S): Sonata  What should I tell my care team before I take this medication? They need to know if you have any of these conditions: Depression Liver disease Lung or breathing disease Substance use disorder Suicidal thoughts, plans, or attempt by you or a family member Unusual sleep behaviors or activities you do not remember An unusual or allergic reaction to zaleplon , other medications, foods, dyes, or preservatives Pregnant or trying to get pregnant Breastfeeding How should I use this medication? Take this medication by mouth with water. Take it as directed on the label. Do not use it more often than directed. There may be unused or extra doses in the bottle after you finish your treatment. Talk to your care team if you have questions about your dose. A special MedGuide will be given to you by the pharmacist with each prescription and refill. Be sure to read this information carefully each time. Talk to your care team about the use of this medication in children. Special care may be needed. Overdosage: If you think you have taken too much of this medicine contact a poison control center or emergency room at once. NOTE: This medicine is only for you. Do not share this medicine with others. What if I miss a dose? This does not apply. This medication should only be taken immediately before going to sleep. Do not take double or extra doses. What may interact with this medication? Barbiturate medications for inducing sleep or treating  seizures Carbamazepine Certain medications for allergies, such as azatadine, clemastine, diphenhydramine Certain medications for mental health conditions Certain medications for pain Cimetidine Erythromycin Medications for fungal infections, such as ketoconazole, fluconazole, or itraconazole Other medications given for sleep Phenytoin Rifampin This list may not describe all possible interactions. Give your health care provider a list of all the medicines, herbs, non-prescription drugs, or dietary supplements you use. Also tell them if you smoke, drink alcohol, or use illegal drugs. Some items may interact with your medicine. What should I watch for while using this medication? Visit your care team for regular checks on your progress. Keep a regular sleep schedule by going to bed at about the same time each night. Avoid caffeine-containing drinks in the evening hours. When sleep medications are used every night for more than a few weeks, they may stop working. Talk to your care team if you still have trouble sleeping. You may do unusual sleep behaviors or activities you do not remember the day after taking this medication. Activities include driving, making or eating food, talking on the phone, sexual activity, or sleep walking. Stop taking this medication and call your care team right away if you find out you have done activities like this. Plan to go to bed and stay in bed for a full night (7 to 8 hours) after you take this medication. You may still be drowsy the morning after taking this medication. This medication may affect your coordination, reaction time, or judgment. Do not drive or operate machinery until you know how this medication  affects you. Sit up or stand slowly to reduce the risk of dizzy or fainting spells. If you or your family notice any changes in your behavior, such as new or worsening depression, thoughts of harming yourself, anxiety, other unusual or disturbing thoughts, or  memory loss, call your care team right away. After you stop taking this medication, you may have trouble falling asleep. This is called rebound insomnia. This problem usually goes away on its own after 1 or 2 nights. What side effects may I notice from receiving this medication? Side effects that you should report to your care team as soon as possible: Allergic reactions--skin rash, itching, hives, swelling of the face, lips, tongue, or throat Change in vision such as blurry vision, seeing halos around lights, vision loss CNS depression--slow or shallow breathing, shortness of breath, feeling faint, dizziness, confusion, difficulty staying awake Mood and behavior changes--anxiety, nervousness, confusion, hallucinations, irritability, hostility, thoughts of suicide or self-harm, worsening mood, feelings of depression Unusual sleep behaviors or activities you do not remember such as driving, eating, or sexual activity Side effects that usually do not require medical attention (report to your care team if they continue or are bothersome): Diarrhea Dizziness Drowsiness the day after use Pain, tingling, or numbness in the hands or feet This list may not describe all possible side effects. Call your doctor for medical advice about side effects. You may report side effects to FDA at 1-800-FDA-1088. Where should I keep my medication? Keep out of the reach of children and pets. This medication can be abused. Keep your medication in a safe place to protect it from theft. Do not share this medication with anyone. Selling or giving away this medication is dangerous and against the law. Store at room temperature between 20 and 25 degrees C (68 and 77 degrees F). Protect from light. This medication may cause accidental overdose and death if taken by other adults, children, or pets. Mix any unused medication with a substance like cat litter or coffee grounds. Then throw the medication away in a sealed container  like a sealed bag or a coffee can with a lid. Do not use the medication after the expiration date. NOTE: This sheet is a summary. It may not cover all possible information. If you have questions about this medicine, talk to your doctor, pharmacist, or health care provider.  2024 Elsevier/Gold Standard (2022-09-11 00:00:00)   Obesity-Hypoventilation Syndrome Obesity-hypoventilation syndrome (OHS) is a condition in which a person cannot efficiently move air in and out of the lungs (ventilate). This condition causes a buildup of carbon dioxidelevels in the blood and a drop in oxygen levels. OHS can increase the risk for: Cor pulmonale, or right-sided heart failure. Left-sided heart failure. Pulmonary hypertension, or high blood pressure in the arteries in the lungs. Too many red blood cells in the body. Disability or death. What are the causes? This condition may be caused by: Being obese with a BMI (body mass index) greater than or equal to 30 kg/m2. Having too much fat around the abdomen, chest, and neck. The brain being unable to properly manage the high carbon dioxide and low oxygen levels. Hormones made by fat cells. These hormones may interfere with breathing. Sleep apnea. This is when breathing stops, pauses, or is shallow during sleep. What are the signs or symptoms? Symptoms of this condition include: Feeling sleepy during the day. Headaches. These may be worse in the morning. Shortness of breath. Snoring, choking, gasping, or trouble breathing during sleep. Poor concentration  or poor memory. Mood changes or feeling irritable. Depression. How is this diagnosed? This condition may be diagnosed by: BMI measurement. Blood tests to measure blood levels of serum bicarbonate, carbon dioxide, and oxygen. Pulse oximetry to measure the amount of oxygen in your blood. This uses a small device that is placed on your finger, earlobe, or toe. Polysomnogram, or sleep study, to check your  breathing patterns and levels of oxygen and carbon dioxide while you sleep. You may also have other tests, including: A chest X-ray to rule out other breathing problems. Lung tests, or pulmonary function tests, to rule out other breathing problems. Electrocardiogram (ECG) or echocardiogram to check for signs of heart failure. How is this treated? This condition may be treated with: A device such as a continuous positive airway pressure (CPAP) machine or a bi-level positive airway pressure (BIPAP) machine. These devices deliver pressure and sometimes oxygen to make breathing easier. A mask may be placed over your nose or mouth. Oxygen if your blood oxygen levels are low. A weight-loss program. Bariatric, or weight-loss, surgery. Tracheostomy. A tube is placed in the windpipe through the neck to help with breathing. Follow these instructions at home:  Medicines Take over-the-counter and prescription medicines only as told by your health care provider. Ask your health care provider what medicines are safe for you. You may be told to avoid medicines such as sedatives and narcotics. These can affect breathing and make OHS worse. Sleeping habits If you are prescribed a CPAP or a BIPAP machine, make sure you understand how to use it. Use your CPAP or BIPAP machine only as told by your health care provider. Try to get at least 8 hours of sleep every night. Eating and drinking  Eat foods that are high in fiber, such as beans, whole grains, and fresh fruits and vegetables. Limit foods that are high in fat and processed sugars, such as fried or sweet foods. Drink enough fluid to keep your urine pale yellow. Do not drink alcohol if: Your health care provider tells you not to drink. You are pregnant, may be pregnant, or are planning to become pregnant. General instructions Follow a diet and exercise plan that helps you reach and keep a healthy weight as told by your health care provider. Exercise  regularly as told by your health care provider. Do not use any products that contain nicotine or tobacco. These products include cigarettes, chewing tobacco, and vaping devices, such as e-cigarettes. If you need help quitting, ask your health care provider. Keep all follow-up visits. This is important. Contact a health care provider if: You develop new or worsening shortness of breath. You are having trouble waking up or staying awake. You are confused. You develop a cough. You have a fever. Get help right away if: You have chest pain. You have fast or irregular heartbeats. You are dizzy or you faint. You have any symptoms of a stroke. BE FAST is an easy way to remember the main warning signs of a stroke: B - Balance. Signs are dizziness, sudden trouble walking, or loss of balance. E - Eyes. Signs are trouble seeing or a sudden change in vision. F - Face. Signs are sudden weakness or numbness of the face, or the face or eyelid drooping on one side. A - Arms. Signs are weakness or numbness in an arm. This happens suddenly and usually on one side of the body. S - Speech. Signs are sudden trouble speaking, slurred speech, or trouble understanding what people  say. T - Time. Time to call emergency services. Write down what time symptoms started. You have other signs of a stroke, such as: A sudden, severe headache with no known cause. Nausea or vomiting. Seizure. These symptoms may be an emergency. Get help right away. Call 911. Do not wait to see if the symptoms will go away. Do not drive yourself to the hospital. Summary Obesity-hypoventilation syndrome (OHS) causes a buildup of carbon dioxidelevels in the blood and a drop in oxygen levels. OHS can increase the risk for heart failure, pulmonary hypertension, disability, and death. Follow your diet and exercise plan as told by your health care provider. Get help right away if you have any symptoms of a stroke. This information is not  intended to replace advice given to you by your health care provider. Make sure you discuss any questions you have with your health care provider. Document Revised: 04/20/2021 Document Reviewed: 04/20/2021 Elsevier Patient Education  2024 [employer].

## 2024-04-03 NOTE — Progress Notes (Signed)
 Provider:  Dedra Gores, MD  Primary Care Physician:  No primary care provider on file. No primary provider on file.     Referring Provider: No referring provider defined for this encounter.          Chief Complaint according to patient   Patient presents with:                HISTORY OF PRESENT ILLNESS:  Jacob Lloyd is a 38 y.o. male patient who is here for revisit 04/03/2024 for  compliance on BiPAP 21/ 17 cm water  in the treatment of severe sleep apnea.  This patient is a former patient of Dr. Audrene and was referred by [facility] nurse practitioner.  He needed a reconnection to sleep care and supplies and was therefore referred by his cardiology providers.  He had known and severe complex sleep apnea and initially had been treated by CPAP in his high school years but this became too uncomfortable and his needs exceeded the CPAP's highest pressures.  He has been using a BiPAP ST 18 therapy since 2015 when he was last tested in [place]  prior to our repeat sleep study.  And the machine was now failing.  He uses the F20 ResMed AirFit fullface mask and large size.  His Epworth Sleepiness Scale was endorsed at 6 out of 24 prior to our sleep study and is now at 2 out of 24, the fatigue severity score had been 29 out of 63 points prior to our last sleep study and is now and only 18 points.  The sleep study revealed few oxygen desaturations and an apnea hypopnea index of 19.8 overall consistent mostly of hypopneas.  This distribution speaks for a hypoventilation disorder he is basically to shallow breathing breathing too shallow.  He did have good REM sleep time there was no oxygen supplementation needed and the new settings were 21 over 17 cm water. .  Chief concern according to patient :  likes the new biPAP 21/ 17 cm water.  Just started night shifts and needs to get more sleep in daytime, asking for a sleep aid.  His problem is yo fall asleep not stay  asleep , and the  short half life medication would be SONATA .      Review of Systems: Out of a complete 14 system review, the patient complains of only the following symptoms, and all other reviewed systems are negative.:   How likely are you to doze in the following situations: 0 = not likely, 1 = slight chance, 2 = moderate chance, 3 = high chance  Sitting and Reading? Watching Television? Sitting inactive in a public place (theater or meeting)? Lying down in the afternoon when circumstances permit? Sitting and talking to someone? Sitting quietly after lunch without alcohol? In a car, while stopped for a few minutes in traffic? As a passenger in a car for an hour without a break?  Total =  2/ 24  From 6/ 24  FSS also significantly reduced since BiPAP re-issued.   Just started night shifts.       Social History   Socioeconomic History   Marital status: Single    Spouse name: Not on file   Number of children: 2   Years of education: Not on file   Highest education level: Not on file  Occupational History   Occupation: Shipping and Receiving   Tobacco Use   Smoking status: Former  Current packs/day: 0.00    Types: Cigarettes    Quit date: 2016    Years since quitting: 9.5   Smokeless tobacco: Never  Vaping Use   Vaping status: Never Used  Substance and Sexual Activity   Alcohol use: Yes    Comment: drinks beer socially   Drug use: Yes    Types: Marijuana    Comment: once per day   Sexual activity: Yes    Birth control/protection: None  Other Topics Concern   Not on file  Social History Narrative   Right handed    Wears contacts    Social Drivers of Health   Financial Resource Strain: Low Risk  (06/30/2022)   Overall Financial Resource Strain (CARDIA)    Difficulty of Paying Living Expenses: Not very hard  Food Insecurity: No Food Insecurity (06/30/2022)   Hunger Vital Sign    Worried About Running Out of Food in the Last Year: Never true    Ran  Out of Food in the Last Year: Never true  Transportation Needs: No Transportation Needs (06/30/2022)   PRAPARE - Administrator, Civil Service (Medical): No    Lack of Transportation (Non-Medical): No  Physical Activity: Sufficiently Active (06/30/2022)   Exercise Vital Sign    Days of Exercise per Week: 7 days    Minutes of Exercise per Session: 90 min  Stress: No Stress Concern Present (06/30/2022)   [employer] of Occupational Health - Occupational Stress Questionnaire    Feeling of Stress : Not at all  Social Connections: Moderately Integrated (06/30/2022)   Social Connection and Isolation Panel    Frequency of Communication with Friends and Family: More than three times a week    Frequency of Social Gatherings with Friends and Family: Twice a week    Attends Religious Services: 1 to 4 times per year    Active Member of [employer] or Organizations: No    Attends Engineer, structural: Never    Marital Status: Living with partner    Family History  Problem Relation Age of Onset   Hypertension Mother    Depression Mother    Cancer Maternal Grandmother    Heart disease Maternal Grandmother    Hyperlipidemia Maternal Grandmother    Hypertension Maternal Grandmother    Hypertension Maternal Grandfather    Hyperlipidemia Maternal Grandfather    Heart disease Maternal Grandfather     Past Medical History:  Diagnosis Date   Gout 11/08/2023   Hypertension    Sleep apnea     Past Surgical History:  Procedure Laterality Date   EYE SURGERY     FRACTURE SURGERY Left    ankle surgery   TONSILLECTOMY  2005     Current Outpatient Medications on File Prior to Visit  Medication Sig Dispense Refill   allopurinol  (ZYLOPRIM ) 100 MG tablet TAKE 1 TABLET DAILY FOR 1 WEEK THEN INCREASE TO 2 TABLETS DAILY (Patient taking differently: Take 100 mg by mouth 2 (two) times daily.) 60 tablet 5   amLODipine -olmesartan  (AZOR ) 5-40 MG tablet Take 1 tablet by mouth daily. 90  tablet 1   ibuprofen (ADVIL) 200 MG tablet Take 400 mg by mouth as needed for mild pain (pain score 1-3).     colchicine  0.6 MG tablet TAKE 2 TABLETS AND THEN 1 HOUR LATE TAKE 1 TABLET FOR TOTAL OF 3 TABLETS 3 tablet 0   No current facility-administered medications on file prior to visit.    No Known Allergies   DIAGNOSTIC  DATA (LABS, IMAGING, TESTING) - I reviewed patient records, labs, notes, testing and imaging myself where available.  Lab Results  Component Value Date   WBC 10.6 (H) 05/07/2023   HGB 13.9 05/07/2023   HCT 43.9 05/07/2023   MCV 94.2 05/07/2023   PLT 315 05/07/2023      Component Value Date/Time   NA 141 11/08/2023 1551   K 4.7 11/08/2023 1551   CL 102 11/08/2023 1551   CO2 23 11/08/2023 1551   GLUCOSE 76 11/08/2023 1551   GLUCOSE 140 (H) 05/07/2023 0803   BUN 15 11/08/2023 1551   CREATININE 1.15 11/08/2023 1551   CALCIUM 9.8 11/08/2023 1551   PROT 7.7 11/08/2023 1551   ALBUMIN 4.4 11/08/2023 1551   AST 24 11/08/2023 1551   ALT 24 11/08/2023 1551   ALKPHOS 77 11/08/2023 1551   BILITOT 0.4 11/08/2023 1551   GFRNONAA >60 05/07/2023 0803   GFRAA 105 03/14/2018 0848   Lab Results  Component Value Date   CHOL 191 11/08/2023   HDL 48 11/08/2023   LDLCALC 116 (H) 11/08/2023   LDLDIRECT 96 06/30/2022   TRIG 152 (H) 11/08/2023   CHOLHDL 4.0 11/08/2023   Lab Results  Component Value Date   HGBA1C 5.0 06/30/2022   Lab Results  Component Value Date   VITAMINB12 276 03/13/2018   Lab Results  Component Value Date   TSH 1.890 06/30/2022    PHYSICAL EXAM:  Vitals:   04/03/24 1527  BP: (!) 139/96  Pulse: 93   No data found. Body mass index is 59 kg/m.   Wt Readings from Last 3 Encounters:  04/03/24 (!) 388 lb (176 kg)  11/08/23 (!) 388 lb 12.8 oz (176.4 kg)  10/24/23 (!) 398 lb 3.2 oz (180.6 kg)     Ht Readings from Last 3 Encounters:  04/03/24 5' 8 (1.727 m)  11/08/23 5' 8 (1.727 m)  10/24/23 5' 8 (1.727 m)      General: The  patient is awake, alert and appears not in acute distress. The patient is groomed. Head: Normocephalic, atraumatic. Neck is supple. Cardiovascular:  Regular rate and cardiac rhythm by pulse, without distended neck veins.  Skin:  Without evidence of ankle edema, or rash. Trunk: obese     NEUROLOGIC EXAM: The patient is awake and alert, oriented to place and time.   Memory subjective described as intact.  Attention span & concentration ability appears normal.  Speech is fluent,  without  dysarthria, dysphonia or aphasia.  Mood and affect are appropriate.   Cranial nerves: no loss of smell or taste reported  Pupils are equal and briskly reactive to light. Extraocular movements in vertical and horizontal planes were intact and without nystagmus. No Diplopia. Visual fields by finger perimetry are intact. Hearing was intact to soft voice and finger rubbing.    Facial sensation intact to fine touch.  Facial motor strength is symmetric and tongue and uvula move midline.  Neck ROM : rotation, tilt and flexion extension were normal for age and shoulder shrug was symmetrical.    Motor exam:  Symmetric bulk, tone and ROM.   Normal tone without cog wheeling, symmetric grip strength .   Deep tendon reflexes: in the  upper and lower extremities are symmetric and intact.      ASSESSMENT AND PLAN :   38 y.o. year old male  here with:    1) Obesity Hypoventilation and high  positive airway pressure needs  21/17 cm water was optimal.  Continue using  BiPAP at said rate, see us  once a year for follow up.   2) Just started a new night shift schedule, getting 10.000 to 12.000 steps at night- no risk of falling asleep at the job, but trouble to get to sleep in daytime. Bedroom is quiet, dark and cool. Needs a sleep aid with a short half life.   Sonata   prescribed.   He is a father of a 70 year old boy,  and can sleep between 8 and 1.30 Pm.   3) addressing the cause of  OSA/ hypoventilation ; main  risk factor is obesity.  He may qualify for zepbound.  Referring to weight and wellness  4) HTN medication has been taken regularly and he reports improved readings   The patient's condition requires frequent monitoring and adjustments in the treatment plan, reflecting the ongoing complexity of care.  This provider is the continuing focal point for all needed services for this condition.  After spending a total time of  25  minutes face to face and time for  history taking, physical and neurologic examination, review of laboratory studies,  personal review of imaging studies, reports and results of other testing and review of referral information / records as far as provided in visit,   Electronically signed by: Dedra Gores, MD 04/03/2024 4:00 PM  [facility] and [facility] Board certified by The [employer] of Sleep Medicine and Diplomate of the [employer] of Sleep Medicine. Board certified In Neurology through the ABPN, Fellow of the [employer] of Neurology.

## 2024-04-04 ENCOUNTER — Encounter (INDEPENDENT_AMBULATORY_CARE_PROVIDER_SITE_OTHER): Payer: Self-pay

## 2024-04-04 ENCOUNTER — Encounter: Payer: Self-pay | Admitting: Neurology

## 2024-04-04 ENCOUNTER — Other Ambulatory Visit: Payer: Self-pay | Admitting: Neurology

## 2024-04-04 MED ORDER — TRAZODONE HCL 50 MG PO TABS: 25.0000 mg | ORAL_TABLET | Freq: Every evening | ORAL | 5 refills | Status: AC | PRN

## 2024-04-15 ENCOUNTER — Institutional Professional Consult (permissible substitution): Admitting: Nurse Practitioner

## 2024-04-22 DIAGNOSIS — G4733 Obstructive sleep apnea (adult) (pediatric): Secondary | ICD-10-CM | POA: Diagnosis not present

## 2024-04-23 ENCOUNTER — Other Ambulatory Visit (HOSPITAL_BASED_OUTPATIENT_CLINIC_OR_DEPARTMENT_OTHER): Payer: Self-pay | Admitting: Family

## 2024-04-26 DIAGNOSIS — G4733 Obstructive sleep apnea (adult) (pediatric): Secondary | ICD-10-CM | POA: Diagnosis not present

## 2024-05-27 DIAGNOSIS — G4733 Obstructive sleep apnea (adult) (pediatric): Secondary | ICD-10-CM | POA: Diagnosis not present

## 2024-06-14 DIAGNOSIS — G4733 Obstructive sleep apnea (adult) (pediatric): Secondary | ICD-10-CM | POA: Diagnosis not present

## 2024-07-16 ENCOUNTER — Other Ambulatory Visit (HOSPITAL_BASED_OUTPATIENT_CLINIC_OR_DEPARTMENT_OTHER): Payer: Self-pay | Admitting: Family

## 2024-07-19 NOTE — Telephone Encounter (Signed)
 Pt of Dr. Raford. Please advise on a refill of this RX

## 2024-08-07 ENCOUNTER — Other Ambulatory Visit (HOSPITAL_BASED_OUTPATIENT_CLINIC_OR_DEPARTMENT_OTHER): Payer: Self-pay | Admitting: Cardiovascular Disease

## 2024-08-14 DIAGNOSIS — G4733 Obstructive sleep apnea (adult) (pediatric): Secondary | ICD-10-CM | POA: Diagnosis not present

## 2024-08-27 ENCOUNTER — Encounter: Payer: Self-pay | Admitting: Podiatry

## 2024-08-27 ENCOUNTER — Ambulatory Visit: Admitting: Podiatry

## 2024-08-27 VITALS — Ht 68.0 in | Wt 388.0 lb

## 2024-08-27 DIAGNOSIS — B351 Tinea unguium: Secondary | ICD-10-CM | POA: Diagnosis not present

## 2024-08-27 DIAGNOSIS — Z79899 Other long term (current) drug therapy: Secondary | ICD-10-CM | POA: Diagnosis not present

## 2024-08-27 DIAGNOSIS — L600 Ingrowing nail: Secondary | ICD-10-CM

## 2024-08-27 MED ORDER — CEPHALEXIN 500 MG PO CAPS: 500.0000 mg | ORAL_CAPSULE | Freq: Three times a day (TID) | ORAL | 0 refills | Status: AC

## 2024-08-27 NOTE — Patient Instructions (Signed)

## 2024-08-27 NOTE — Progress Notes (Unsigned)
 Subjective:  Patient ID: Jacob Lloyd, male    DOB: 01-12-1986,  MRN: [id number]  Chief Complaint  Patient presents with   Ingrown Toenail    Rm 12 Patient is here for ingrown toe nail of the left great toe (lateral). Patient would like to discuss treatment for nail fungus.    Discussed the use of AI scribe software for clinical note transcription with the patient, who gave verbal consent to proceed.  History of Present Illness Jacob Lloyd is a 38 year old male who presents with an ingrown toenail on the left big toe.  He has had left great toe pain for about a week with localized discomfort. A piece of the nail came out with some drainage and mild bleeding. He has not tried any treatments.  He is also concerned toenail discoloration and possible fungus.  No pain in the other toenails.  He is not diabetic, does not take blood thinners, takes blood pressure medication, and has no known drug allergies.  He works on his feet, which may aggravate the toe pain. He rarely drinks alcohol.      Objective:  There were no vitals filed for this visit.  Physical Exam General: AAO x3, NAD  Dermatological: Incurvation present to the left lateral hallux toenail with tenderness palpation and localized edema and erythema without any ascending cellulitis present.  There is no drainage or pus.  In general the nails are all hypertrophic, dystrophic with yellow, brown discoloration and some old debris is present.  There are no open lesions.  Vascular: Dorsalis Pedis artery and Posterior Tibial artery pedal pulses are 2/4 bilateral with immedate capillary fill time. There is no pain with calf compression, swelling, warmth, erythema.   Neruologic: Grossly intact via light touch bilateral.   Musculoskeletal: No gross boney pedal deformities bilateral. No pain, crepitus, or limitation noted with foot and ankle range of motion bilateral. Muscular strength 5/5 in all groups tested  bilateral.     No images are attached to the encounter.    Results     Assessment:   1. Long-term use of high-risk medication   2. Dermatophytosis of nail   3. Ingrown toenail      Plan:  Patient was evaluated and treated and all questions answered.  Assessment and Plan Assessment & Plan Ingrown toenail, left great toe Procedure required to address ingrown toenail with drainage and bleeding. - At this time, the patient is requesting partial nail removal with chemical matricectomy to the symptomatic portion of the nail. Risks and complications were discussed with the patient for which they understand and written consent was obtained. Under sterile conditions a total of 3 mL of a mixture of 2% lidocaine  plain and 0.5% Marcaine plain was infiltrated in a hallux block fashion. Once anesthetized, the skin was prepped in sterile fashion. A tourniquet was then applied. Next the lateral aspect of hallux nail border was then sharply excised making sure to remove the entire offending nail border. Once the nails were ensured to be removed area was debrided and the underlying skin was intact. There is no purulence identified in the procedure. Next phenol was then applied under standard conditions and copiously irrigated.  Silvadene was applied. A dry sterile dressing was applied. After application of the dressing the tourniquet was removed and there is found to be an immediate capillary refill time to the digit. The patient tolerated the procedure well any complications. Post procedure instructions were discussed the patient for which he verbally understood. Discussed  signs/symptoms of infection and directed to call the office immediately should any occur or go directly to the emergency room. In the meantime, encouraged to call the office with any questions, concerns, changes symptoms. - Recommended wearing wider or open-toed shoes to avoid pressure. - Prescribed Tylenol  or Motrin for pain  management. - Prescribed cephalexin.  Onychomycosis Discussed treatment options with emphasis on oral Lamisil due to higher success rate, requiring liver function monitoring. - Ordered blood work to check liver function and blood counts before starting oral antifungal treatment. - Discussed oral antifungal treatment (Lamisil) with potential side effects including upset stomach, body aches, muscle aches, and metallic taste. - Advised against alcohol consumption during treatment.   Donnice JONELLE Fees DPM

## 2024-09-13 ENCOUNTER — Other Ambulatory Visit (HOSPITAL_BASED_OUTPATIENT_CLINIC_OR_DEPARTMENT_OTHER): Payer: Self-pay | Admitting: Family

## 2024-10-16 ENCOUNTER — Other Ambulatory Visit (HOSPITAL_BASED_OUTPATIENT_CLINIC_OR_DEPARTMENT_OTHER): Payer: Self-pay | Admitting: Family

## 2024-10-23 NOTE — Telephone Encounter (Signed)
 Pt of Dr. Raford. Does Dr. Raford want to refill? Please advise.

## 2025-04-07 ENCOUNTER — Ambulatory Visit: Admitting: Adult Health
